# Patient Record
Sex: Female | Born: 1985
Health system: Southern US, Community
[De-identification: ages and names within clinical notes are randomized; demographics above are authoritative.]

## PROBLEM LIST (undated history)

## (undated) ENCOUNTER — Inpatient Hospital Stay (HOSPITAL_COMMUNITY): Payer: Self-pay

## (undated) ENCOUNTER — Emergency Department (HOSPITAL_BASED_OUTPATIENT_CLINIC_OR_DEPARTMENT_OTHER): Admission: EM | Payer: Self-pay | Source: Home / Self Care

## (undated) DIAGNOSIS — I209 Angina pectoris, unspecified: Secondary | ICD-10-CM

## (undated) HISTORY — PX: NO PAST SURGERIES: SHX2092

---

## 2004-08-14 ENCOUNTER — Encounter: Admission: RE | Admit: 2004-08-14 | Discharge: 2004-08-14 | Payer: Self-pay | Admitting: Emergency Medicine

## 2012-01-22 ENCOUNTER — Other Ambulatory Visit (HOSPITAL_BASED_OUTPATIENT_CLINIC_OR_DEPARTMENT_OTHER): Payer: Self-pay | Admitting: Internal Medicine

## 2012-01-22 ENCOUNTER — Ambulatory Visit (HOSPITAL_BASED_OUTPATIENT_CLINIC_OR_DEPARTMENT_OTHER)
Admission: RE | Admit: 2012-01-22 | Discharge: 2012-01-22 | Disposition: A | Discharge status: Home / Self Care | Payer: PRIVATE HEALTH INSURANCE | Source: Ambulatory Visit | Attending: Internal Medicine | Admitting: Internal Medicine

## 2012-01-22 DIAGNOSIS — IMO0002 Reserved for concepts with insufficient information to code with codable children: Secondary | ICD-10-CM

## 2012-01-22 DIAGNOSIS — R609 Edema, unspecified: Secondary | ICD-10-CM

## 2012-02-18 ENCOUNTER — Inpatient Hospital Stay (HOSPITAL_COMMUNITY)
Admission: AD | Admit: 2012-02-18 | Discharge: 2012-02-18 | Disposition: A | Discharge status: Home / Self Care | Payer: Medicaid Other | Source: Ambulatory Visit | Attending: Obstetrics & Gynecology | Admitting: Obstetrics & Gynecology

## 2012-02-18 ENCOUNTER — Encounter (HOSPITAL_COMMUNITY): Payer: Self-pay | Admitting: *Deleted

## 2012-02-18 ENCOUNTER — Inpatient Hospital Stay (HOSPITAL_COMMUNITY): Payer: Medicaid Other

## 2012-02-18 DIAGNOSIS — Z3490 Encounter for supervision of normal pregnancy, unspecified, unspecified trimester: Secondary | ICD-10-CM

## 2012-02-18 DIAGNOSIS — O2390 Unspecified genitourinary tract infection in pregnancy, unspecified trimester: Secondary | ICD-10-CM | POA: Insufficient documentation

## 2012-02-18 DIAGNOSIS — R109 Unspecified abdominal pain: Secondary | ICD-10-CM | POA: Insufficient documentation

## 2012-02-18 DIAGNOSIS — B373 Candidiasis of vulva and vagina: Secondary | ICD-10-CM | POA: Insufficient documentation

## 2012-02-18 DIAGNOSIS — B3731 Acute candidiasis of vulva and vagina: Secondary | ICD-10-CM | POA: Insufficient documentation

## 2012-02-18 DIAGNOSIS — O209 Hemorrhage in early pregnancy, unspecified: Secondary | ICD-10-CM | POA: Insufficient documentation

## 2012-02-18 HISTORY — DX: Angina pectoris, unspecified: I20.9

## 2012-02-18 LAB — POCT PREGNANCY, URINE: Preg Test, Ur: POSITIVE — AB

## 2012-02-18 LAB — URINALYSIS, ROUTINE W REFLEX MICROSCOPIC
Bilirubin Urine: NEGATIVE
Glucose, UA: NEGATIVE mg/dL
Hgb urine dipstick: NEGATIVE
Ketones, ur: 15 mg/dL — AB
Nitrite: NEGATIVE
Protein, ur: NEGATIVE mg/dL
Specific Gravity, Urine: >1.03 — ABNORMAL HIGH (ref 1.005–1.030)
pH: 6 (ref 5.0–8.0)

## 2012-02-18 LAB — CBC
HCT: 33 % — ABNORMAL LOW (ref 36.0–46.0)
Hemoglobin: 10.6 g/dL — ABNORMAL LOW (ref 12.0–15.0)
Platelets: 305 10*3/uL (ref 150–400)
RBC: 4.09 MIL/uL (ref 3.87–5.11)
WBC: 5.3 10*3/uL (ref 4.0–10.5)

## 2012-02-18 LAB — WET PREP, GENITAL

## 2012-02-18 MED ORDER — FLUCONAZOLE 150 MG PO TABS: 150.0000 mg | ORAL_TABLET | Freq: Once | ORAL | Status: DC

## 2012-02-18 NOTE — MAU Note (Signed)
Pt verbalizes understanding 

## 2012-02-18 NOTE — MAU Note (Signed)
Patient states she had a positive pregnancy test a a MD on [address] about 1 1/2 weeks ago.

## 2012-02-18 NOTE — MAU Provider Note (Signed)
History     CSN: [id number]  Arrival date and time: 02/18/12 0936   None     Chief Complaint  Patient presents with  . Abdominal Pain  . Possible Pregnancy   HPI Cramping x 3 days, no bleeding. Planning care at [facility].     Past Medical History  Diagnosis Date  . Anginal pain     intermittent- had cardiac workup- unknown cause    Past Surgical History  Procedure Date  . No past surgeries     Family History  Problem Relation Age of Onset  . Other Neg Hx     History  Substance Use Topics  . Smoking status: Former Games developer  . Smokeless tobacco: Never Used  . Alcohol Use: No    Allergies: No Known Allergies  No prescriptions prior to admission    Review of Systems  Constitutional: Negative.   Respiratory: Negative.   Cardiovascular: Negative.   Gastrointestinal: Positive for abdominal pain. Negative for nausea, vomiting, diarrhea and constipation.  Genitourinary: Negative for dysuria, urgency, frequency, hematuria and flank pain.       Negative for vaginal bleeding, vaginal discharge, dyspareunia  Musculoskeletal: Negative.   Neurological: Negative.   Psychiatric/Behavioral: Negative.    Physical Exam   Blood pressure 122/65, pulse 88, temperature 98.7 F (37.1 C), temperature source Oral, resp. rate 18, height 5' 8.5" (1.74 m), weight 273 lb 9.6 oz (124.104 kg), last menstrual period 01/02/2012, SpO2 100.00%.  Physical Exam  Constitutional: She is oriented to person, place, and time. She appears well-developed and well-nourished. No distress.  HENT:  Head: Normocephalic and atraumatic.  Cardiovascular: Normal rate.   Respiratory: Effort normal. No respiratory distress.  GI: Soft. She exhibits no distension and no mass. There is no tenderness. There is no rebound and no guarding.  Genitourinary: There is no rash or lesion on the right labia. There is no rash or lesion on the left labia. Uterus is not deviated, not enlarged, not fixed and not tender.  Cervix exhibits no motion tenderness, no discharge and no friability. Right adnexum displays no mass, no tenderness and no fullness. Left adnexum displays no mass, no tenderness and no fullness. No erythema, tenderness or bleeding around the vagina. No vaginal discharge found.  Neurological: She is alert and oriented to person, place, and time.  Skin: Skin is warm and dry.  Psychiatric: She has a normal mood and affect.    MAU Course  Procedures Results for orders placed during the hospital encounter of 02/18/12 (from the past 24 hour(s))  URINALYSIS, ROUTINE W REFLEX MICROSCOPIC     Status: Abnormal   Collection Time   02/18/12 10:28 AM      Component Value Range   Color, Urine YELLOW  YELLOW   APPearance CLEAR  CLEAR   Specific Gravity, Urine >1.030 (*) 1.005 - 1.030   pH 6.0  5.0 - 8.0   Glucose, UA NEGATIVE  NEGATIVE mg/dL   Hgb urine dipstick NEGATIVE  NEGATIVE   Bilirubin Urine NEGATIVE  NEGATIVE   Ketones, ur 15 (*) NEGATIVE mg/dL   Protein, ur NEGATIVE  NEGATIVE mg/dL   Urobilinogen, UA 1.0  0.0 - 1.0 mg/dL   Nitrite NEGATIVE  NEGATIVE   Leukocytes, UA NEGATIVE  NEGATIVE  POCT PREGNANCY, URINE     Status: Abnormal   Collection Time   02/18/12 10:32 AM      Component Value Range   Preg Test, Ur POSITIVE (*) NEGATIVE  CBC     Status: Abnormal  Collection Time   02/18/12 11:05 AM      Component Value Range   WBC 5.3  4.0 - 10.5 K/uL   RBC 4.09  3.87 - 5.11 MIL/uL   Hemoglobin 10.6 (*) 12.0 - 15.0 g/dL   HCT 86.5 (*) 78.4 - 69.6 %   MCV 80.7  78.0 - 100.0 fL   MCH 25.9 (*) 26.0 - 34.0 pg   MCHC 32.1  30.0 - 36.0 g/dL   RDW 29.5  28.4 - 13.2 %   Platelets 305  150 - 400 K/uL  ABO/RH     Status: Normal   Collection Time   02/18/12 11:05 AM      Component Value Range   ABO/RH(D) O POS    HCG, QUANTITATIVE, PREGNANCY     Status: Abnormal   Collection Time   02/18/12 11:05 AM      Component Value Range   hCG, Beta Chain, Quant, S 11735 (*) <5 mIU/mL  WET PREP, GENITAL      Status: Abnormal   Collection Time   02/18/12  1:26 PM      Component Value Range   Yeast Wet Prep HPF POC NONE SEEN  NONE SEEN   Trich, Wet Prep FEW (*) NONE SEEN   Clue Cells Wet Prep HPF POC RARE (*) NONE SEEN   WBC, Wet Prep HPF POC RARE (*) NONE SEEN   U/s: 6.1 week IUP, + FHR, c/w LMP, moderate subchorionic hemorrhage   Assessment and Plan   Cramping in early pregnancy Subchorionic hemorrhage Yeast vaginitis  Follow-up Information    Follow up with [facility]. (as soon as possible for prenatal care)    Contact information:   [address], [address] [place] [address] [phone]          Medication List     As of 02/18/2012  3:17 PM    START taking these medications         fluconazole 150 MG tablet   Commonly known as: DIFLUCAN   Take 1 tablet (150 mg total) by mouth once.      CONTINUE taking these medications         etodolac 600 MG 24 hr tablet   Commonly known as: LODINE XL      prenatal multivitamin Tabs      traMADol-acetaminophen 37.5-325 MG per tablet   Commonly known as: ULTRACET          Where to get your medications    These are the prescriptions that you need to pick up. We sent them to a specific pharmacy, so you will need to go there to get them.   [id number] - [place] ([place]), [place] - [address]    [address] [place] ([place]) [place] [address]    Phone: [phone]        fluconazole 150 MG tablet              Ariana Rice 02/18/2012, 3:16 PM

## 2012-02-18 NOTE — MAU Note (Signed)
Been having lower abd cramping.  Found out preg a wk ago. Plans to go to [facility]. Waiting on medicaid.

## 2012-02-18 NOTE — MAU Note (Signed)
Patient states she has had upper and lower abdominal pain off and on for about 3 days. Patient denies bleeding or vaginal discharge. Nausea, no vomiting for about 4 days.

## 2012-02-19 LAB — GC/CHLAMYDIA PROBE AMP, GENITAL: GC Probe Amp, Genital: NEGATIVE

## 2012-03-25 LAB — OB RESULTS CONSOLE HGB/HCT, BLOOD
HCT: 34 %
Hemoglobin: 11.3 g/dL

## 2012-03-25 LAB — OB RESULTS CONSOLE PLATELET COUNT: Platelets: 324 10*3/uL

## 2012-03-25 LAB — OB RESULTS CONSOLE RUBELLA ANTIBODY, IGM: Rubella: IMMUNE

## 2012-03-25 LAB — OB RESULTS CONSOLE HEPATITIS B SURFACE ANTIGEN: Hepatitis B Surface Ag: NEGATIVE

## 2012-03-25 LAB — OB RESULTS CONSOLE ABO/RH

## 2012-03-25 LAB — OB RESULTS CONSOLE RPR: RPR: NONREACTIVE

## 2012-03-25 LAB — SICKLE CELL SCREEN: Sickle Cell Screen: NEGATIVE

## 2012-05-18 NOTE — L&D Delivery Note (Signed)
Delivery Note At 3:49 PM a viable female was delivered via Vaginal, Spontaneous Delivery (Presentation: Right Occiput Anterior).  APGAR: 9, 9.   Placenta status: delivered w/cord traction; intact .  Cord: 3 vessels with the following complications: None.  Anesthesia: Epidural  Episiotomy: None Lacerations: 2nd degree Suture Repair: 2.0 3.0 vicryl rapide Est. Blood Loss (mL): 400 ml  Mom to postpartum.  Baby to nursery-stable.  JACKSON-MOORE,Haven Foss A 10/02/2012, 4:29 PM

## 2012-07-07 ENCOUNTER — Other Ambulatory Visit (HOSPITAL_COMMUNITY): Payer: Self-pay | Admitting: Obstetrics & Gynecology

## 2012-07-07 DIAGNOSIS — Z3689 Encounter for other specified antenatal screening: Secondary | ICD-10-CM

## 2012-07-07 DIAGNOSIS — IMO0002 Reserved for concepts with insufficient information to code with codable children: Secondary | ICD-10-CM

## 2012-07-12 ENCOUNTER — Ambulatory Visit (HOSPITAL_COMMUNITY): Payer: Medicaid Other

## 2012-07-18 ENCOUNTER — Ambulatory Visit (HOSPITAL_COMMUNITY)
Admission: RE | Admit: 2012-07-18 | Discharge: 2012-07-18 | Disposition: A | Discharge status: Home / Self Care | Payer: Medicaid Other | Source: Ambulatory Visit | Attending: Obstetrics & Gynecology | Admitting: Obstetrics & Gynecology

## 2012-07-18 DIAGNOSIS — Z3689 Encounter for other specified antenatal screening: Secondary | ICD-10-CM

## 2012-07-18 DIAGNOSIS — O358XX0 Maternal care for other (suspected) fetal abnormality and damage, not applicable or unspecified: Secondary | ICD-10-CM | POA: Insufficient documentation

## 2012-07-18 DIAGNOSIS — Z1389 Encounter for screening for other disorder: Secondary | ICD-10-CM | POA: Insufficient documentation

## 2012-07-18 DIAGNOSIS — IMO0002 Reserved for concepts with insufficient information to code with codable children: Secondary | ICD-10-CM

## 2012-07-18 DIAGNOSIS — Z363 Encounter for antenatal screening for malformations: Secondary | ICD-10-CM | POA: Insufficient documentation

## 2012-08-12 ENCOUNTER — Encounter: Payer: Self-pay | Admitting: *Deleted

## 2012-08-15 ENCOUNTER — Ambulatory Visit (INDEPENDENT_AMBULATORY_CARE_PROVIDER_SITE_OTHER): Payer: Medicaid Other | Admitting: Obstetrics & Gynecology

## 2012-08-15 VITALS — BP 119/81 | Temp 98.3°F | Wt 282.4 lb

## 2012-08-15 DIAGNOSIS — Z3403 Encounter for supervision of normal first pregnancy, third trimester: Secondary | ICD-10-CM

## 2012-08-15 DIAGNOSIS — O4700 False labor before 37 completed weeks of gestation, unspecified trimester: Secondary | ICD-10-CM

## 2012-08-15 DIAGNOSIS — L989 Disorder of the skin and subcutaneous tissue, unspecified: Secondary | ICD-10-CM

## 2012-08-15 DIAGNOSIS — Z3400 Encounter for supervision of normal first pregnancy, unspecified trimester: Secondary | ICD-10-CM

## 2012-08-15 LAB — POCT URINALYSIS DIPSTICK
Spec Grav, UA: 1.02
Urobilinogen, UA: NEGATIVE

## 2012-08-15 NOTE — Progress Notes (Signed)
Pulse- 106.  Patient states that she has an abnormal white discharge with burning/irritation.

## 2012-08-15 NOTE — Patient Instructions (Addendum)
Folliculitis   Folliculitis is redness, soreness, and swelling (inflammation) of the hair follicles. This condition can occur anywhere on the body. People with weakened immune systems, diabetes, or obesity have a greater risk of getting folliculitis.  CAUSES   Bacterial infection. This is the most common cause.   Fungal infection.   Viral infection.   Contact with certain chemicals, especially oils and tars.  Long-term folliculitis can result from bacteria that live in the nostrils. The bacteria may trigger multiple outbreaks of folliculitis over time.  SYMPTOMS  Folliculitis most commonly occurs on the scalp, thighs, legs, back, buttocks, and areas where hair is shaved frequently. An early sign of folliculitis is a small, white or yellow, pus-filled, itchy lesion (pustule). These lesions appear on a red, inflamed follicle. They are usually less than 0.2 inches (5 mm) wide. When there is an infection of the follicle that goes deeper, it becomes a boil or furuncle. A group of closely packed boils creates a larger lesion (carbuncle). Carbuncles tend to occur in hairy, sweaty areas of the body.  DIAGNOSIS   Your caregiver can usually tell what is wrong by doing a physical exam. A sample may be taken from one of the lesions and tested in a lab. This can help determine what is causing your folliculitis.  TREATMENT   Treatment may include:   Applying warm compresses to the affected areas.   Taking antibiotic medicines orally or applying them to the skin.   Draining the lesions if they contain a large amount of pus or fluid.   Laser hair removal for cases of long-lasting folliculitis. This helps to prevent regrowth of the hair.  HOME CARE INSTRUCTIONS   Apply warm compresses to the affected areas as directed by your caregiver.   If antibiotics are prescribed, take them as directed. Finish them even if you start to feel better.   You may take over-the-counter medicines to relieve itching.   Do not shave  irritated skin.   Follow up with your caregiver as directed.  SEEK IMMEDIATE MEDICAL CARE IF:    You have increasing redness, swelling, or pain in the affected area.   You have a fever.  MAKE SURE YOU:   Understand these instructions.   Will watch your condition.   Will get help right away if you are not doing well or get worse.  Document Released: 07/13/2001 Document Revised: 11/03/2011 Document Reviewed: 08/04/2011  ExitCare Patient Information 2013 [employer].

## 2012-08-16 ENCOUNTER — Encounter: Payer: Self-pay | Admitting: Obstetrics & Gynecology

## 2012-08-16 DIAGNOSIS — Z3400 Encounter for supervision of normal first pregnancy, unspecified trimester: Secondary | ICD-10-CM | POA: Insufficient documentation

## 2012-08-16 NOTE — Progress Notes (Signed)
Doing well 

## 2012-08-22 ENCOUNTER — Ambulatory Visit (INDEPENDENT_AMBULATORY_CARE_PROVIDER_SITE_OTHER): Payer: Medicaid Other | Admitting: Obstetrics & Gynecology

## 2012-08-22 ENCOUNTER — Encounter: Payer: Self-pay | Admitting: Obstetrics & Gynecology

## 2012-08-22 VITALS — BP 119/76 | Temp 98.0°F | Wt 285.0 lb

## 2012-08-22 DIAGNOSIS — Z3400 Encounter for supervision of normal first pregnancy, unspecified trimester: Secondary | ICD-10-CM

## 2012-08-22 DIAGNOSIS — E669 Obesity, unspecified: Secondary | ICD-10-CM

## 2012-08-22 LAB — POCT URINALYSIS DIPSTICK
Bilirubin, UA: NEGATIVE
Spec Grav, UA: 1.02
pH, UA: 7

## 2012-08-22 NOTE — Progress Notes (Signed)
SG 1020,pH 5, WBC +2, KET +1

## 2012-08-22 NOTE — Progress Notes (Signed)
Pulse-97 No complaints

## 2012-08-22 NOTE — Patient Instructions (Signed)
Fetal Monitoring, Nonstress Test The nonstress test (NST) is a procedure that monitors the increase of the fetal heart rate in response to the fetal movement. An increase in the fetal heart rate during fetal movement shows that the pregnancy is normal. The increase also shows the well-being of the baby's central nervous system. Fetal activity may be spontaneous, induced a by uterine contraction, or induced by external stimulation with an artificial voice box. Oxytocin stimulation is not used in this procedure. This test is also done to see if there are problems with the pregnancy and the baby. Identifying and correcting problems may prevent serious problems from developing with the fetus, including fetal loss.  OTHER TECHNIQUES OF MONITORING YOUR BABY PRIOR TO BIRTH:  Fetal movement assessment. This is done by the pregnant woman herself by counting and recording the baby's movements over a certain period of theme.  Contraction stress test (CST). This test monitors the baby's heart rate during a contraction of the uterus.  Fetal biophysical profile (BPP). This measures and evaluates 5 observations of the baby:  The nonstress test.  The baby's breathing.  The baby's movements.  The baby's muscle tone.  The amount of fluid in the amniotic sac.  Modified biophysical profile. This measures the volume of fluid in different parts of the amniotic sac (amniotic fluid index) and the results of the nonstress test.  Umbilical artery doppler velocimetry. This evaluates the blood flow through the umbilical cord. There are several very serious problems that cannot be predicted or detected with any of the fetal monitoring procedures. These problems include separation (abruption) of the placenta and choking of the baby with the umbilical cord (umbilical cord accident). Your caregiver will help you understand the tests and what they mean for you and your baby. It is your responsibility to obtain the results of  your test. LET YOUR CAREGIVER KNOW ABOUT:  Any medications you are taking including prescription and over-the-counter drugs, herbs, eye drops and creams.  If you have a fever.  If you have an infection.  If you are sick. RISKS AND COMPLICATIONS  There are no risks or complications to the mother or fetus with this test. BEFORE THE PROCEDURE  Do not take medications that may increase or decrease the baby's heart rate and/or movements.  Have a full meal at least 2 hours before the test.  Do not smoke if you are pregnant. If you smoke, stop at least 2 days before the test. It is a good idea not to smoke at all when you are pregnant. PROCEDURE The NST is based on the idea that the heart rate of a normal baby will speed up while the baby is moving around. This is an indicator of a normal working pregnancy. Loss of movement is seen commonly while the baby is sleeping or if there are problems in the pregnancy. Smoking may hurt test results.  With the patient lying on her side, the fetal heart rate is checked with an electrode on the belly.  The line drawn from a recording instrument (tracing) is observed for fetal heart rate accelerations that speed up at least 15 beats per minute above resting, and last 15 seconds. Tracings of 40 minutes or longer may be necessary.  Sound stimulation of the healthy fetus may speed up a baby's heart. This also means the baby is healthy. Stimulation helps to reduce testing time and helps find problems in the pregnancy if there is any. To do this, an artificial voice box is   put on the mother's belly for about 12 seconds. This may be repeated up to 3 times for progressively longer durations.  Results are categorized as normal (reactive) or abnormal (nonreactive). Commonly, the NST is considered normal (reactive) if there are 2 or more fetal heart rate accelerations as described inside a 20-minute period. This is with or without fetal movement the mother feels. A  nonreactive NST is one that does not have enough fetal heart rate accelerations over a 40 minute period.  Abnormal testing may need further testing.  Your caregiver will help you understand your tests and what they mean for you and your baby. It should be noted also that:  The NST can be nonreactive 50% of the time in weeks 24 to 28 of a normal pregnancy.  The NST can be nonreactive 15% of the time in weeks 28 to 32 of a normal pregnancy.  Lower fetal heart rate (decelerations) may be seen in 50% of NST's, but if they are consistent (3 in 20 minutes), it increases the risk for Cesarean section.  Decelerations of the fetal heart rate that last for one minute or longer indicates a very serious problem with the baby and a Cesarean section may be needed right away. AFTER THE PROCEDURE  You may go home and resume your usual activities, or as directed by your caregiver. HOME CARE INSTRUCTIONS   Follow your caregiver's advice and recommendations.  Beware of your baby's movements. Are they normal, less than usual or more than usual?  Make and keep the rest of your prenatal appointments. SEEK MEDICAL CARE IF:   You develop a temperature of 100 F (37.9 C) or higher.  You have a bloody mucus discharge from the vagina (a bloody show). SEEK IMMEDIATE MEDICAL CARE IF:   You do not feel the baby move.  You think the baby's movements are less than usual or more than usual.  You develop contractions.  You develop vaginal bleeding.  You develop belly (abdominal) pain.  You have leaking or a gush of fluid from the vagina. Document Released: 04/24/2002 Document Revised: 07/27/2011 Document Reviewed: 08/28/2008 Exit[employer] Patient Information 2013 [employer].  

## 2012-08-23 ENCOUNTER — Encounter: Payer: Self-pay | Admitting: Obstetrics & Gynecology

## 2012-08-23 NOTE — Progress Notes (Signed)
NST OK.

## 2012-08-29 ENCOUNTER — Encounter: Payer: Self-pay | Admitting: Obstetrics & Gynecology

## 2012-08-29 ENCOUNTER — Ambulatory Visit (INDEPENDENT_AMBULATORY_CARE_PROVIDER_SITE_OTHER): Payer: Medicaid Other | Admitting: Obstetrics & Gynecology

## 2012-08-29 ENCOUNTER — Encounter: Payer: Self-pay | Admitting: *Deleted

## 2012-08-29 VITALS — BP 113/76 | Temp 98.1°F | Wt 286.2 lb

## 2012-08-29 DIAGNOSIS — Z3483 Encounter for supervision of other normal pregnancy, third trimester: Secondary | ICD-10-CM

## 2012-08-29 DIAGNOSIS — E669 Obesity, unspecified: Secondary | ICD-10-CM

## 2012-08-29 DIAGNOSIS — Z3480 Encounter for supervision of other normal pregnancy, unspecified trimester: Secondary | ICD-10-CM

## 2012-08-29 LAB — POCT URINALYSIS DIPSTICK
Bilirubin, UA: NEGATIVE
Blood, UA: NEGATIVE
Glucose, UA: NEGATIVE
Nitrite, UA: NEGATIVE
Spec Grav, UA: 1.025
Urobilinogen, UA: NEGATIVE

## 2012-08-29 NOTE — Progress Notes (Signed)
Pulse- 96 

## 2012-08-29 NOTE — Patient Instructions (Addendum)
Pregnancy - Third Trimester  The third trimester of pregnancy (the last 3 months) is a period of the most rapid growth for you and your baby. The baby approaches a length of 20 inches and a weight of 6 to 10 pounds. The baby is adding on fat and getting ready for life outside your body. While inside, babies have periods of sleeping and waking, suck their thumbs, and hiccups. You can often feel small contractions of the uterus. This is false labor. It is also called Braxton-Hicks contractions. This is like a practice for labor. The usual problems in this stage of pregnancy include more difficulty breathing, swelling of the hands and feet from water retention, and having to urinate more often because of the uterus and baby pressing on your bladder.   PRENATAL EXAMS  · Blood work may continue to be done during prenatal exams. These tests are done to check on your health and the probable health of your baby. Blood work is used to follow your blood levels (hemoglobin). Anemia (low hemoglobin) is common during pregnancy. Iron and vitamins are given to help prevent this. You may also continue to be checked for diabetes. Some of the past blood tests may be done again.  · The size of the uterus is measured during each visit. This makes sure your baby is growing properly according to your pregnancy dates.  · Your blood pressure is checked every prenatal visit. This is to make sure you are not getting toxemia.  · Your urine is checked every prenatal visit for infection, diabetes and protein.  · Your weight is checked at each visit. This is done to make sure gains are happening at the suggested rate and that you and your baby are growing normally.  · Sometimes, an ultrasound is performed to confirm the position and the proper growth and development of the baby. This is a test done that bounces harmless sound waves off the baby so your caregiver can more accurately determine due dates.  · Discuss the type of pain medication and  anesthesia you will have during your labor and delivery.  · Discuss the possibility and anesthesia if a Cesarean Section might be necessary.  · Inform your caregiver if there is any mental or physical violence at home.  Sometimes, a specialized non-stress test, contraction stress test and biophysical profile are done to make sure the baby is not having a problem. Checking the amniotic fluid surrounding the baby is called an amniocentesis. The amniotic fluid is removed by sticking a needle into the belly (abdomen). This is sometimes done near the end of pregnancy if an early delivery is required. In this case, it is done to help make sure the baby's lungs are mature enough for the baby to live outside of the womb. If the lungs are not mature and it is unsafe to deliver the baby, an injection of cortisone medication is given to the mother 1 to 2 days before the delivery. This helps the baby's lungs mature and makes it safer to deliver the baby.  CHANGES OCCURING IN THE THIRD TRIMESTER OF PREGNANCY  Your body goes through many changes during pregnancy. They vary from person to person. Talk to your caregiver about changes you notice and are concerned about.  · During the last trimester, you have probably had an increase in your appetite. It is normal to have cravings for certain foods. This varies from person to person and pregnancy to pregnancy.  · You may begin to   get stretch marks on your hips, abdomen, and breasts. These are normal changes in the body during pregnancy. There are no exercises or medications to take which prevent this change.  · Constipation may be treated with a stool softener or adding bulk to your diet. Drinking lots of fluids, fiber in vegetables, fruits, and whole grains are helpful.  · Exercising is also helpful. If you have been very active up until your pregnancy, most of these activities can be continued during your pregnancy. If you have been less active, it is helpful to start an exercise  program such as walking. Consult your caregiver before starting exercise programs.  · Avoid all smoking, alcohol, un-prescribed drugs, herbs and "street drugs" during your pregnancy. These chemicals affect the formation and growth of the baby. Avoid chemicals throughout the pregnancy to ensure the delivery of a healthy infant.  · Backache, varicose veins and hemorrhoids may develop or get worse.  · You will tire more easily in the third trimester, which is normal.  · The baby's movements may be stronger and more often.  · You may become short of breath easily.  · Your belly button may stick out.  · A yellow discharge may leak from your breasts called colostrum.  · You may have a bloody mucus discharge. This usually occurs a few days to a week before labor begins.  HOME CARE INSTRUCTIONS   · Keep your caregiver's appointments. Follow your caregiver's instructions regarding medication use, exercise, and diet.  · During pregnancy, you are providing food for you and your baby. Continue to eat regular, well-balanced meals. Choose foods such as meat, fish, milk and other low fat dairy products, vegetables, fruits, and whole-grain breads and cereals. Your caregiver will tell you of the ideal weight gain.  · A physical sexual relationship may be continued throughout pregnancy if there are no other problems such as early (premature) leaking of amniotic fluid from the membranes, vaginal bleeding, or belly (abdominal) pain.  · Exercise regularly if there are no restrictions. Check with your caregiver if you are unsure of the safety of your exercises. Greater weight gain will occur in the last 2 trimesters of pregnancy. Exercising helps:  · Control your weight.  · Get you in shape for labor and delivery.  · You lose weight after you deliver.  · Rest a lot with legs elevated, or as needed for leg cramps or low back pain.  · Wear a good support or jogging bra for breast tenderness during pregnancy. This may help if worn during  sleep. Pads or tissues may be used in the bra if you are leaking colostrum.  · Do not use hot tubs, steam rooms, or saunas.  · Wear your seat belt when driving. This protects you and your baby if you are in an accident.  · Avoid raw meat, cat litter boxes and soil used by cats. These carry germs that can cause birth defects in the baby.  · It is easier to loose urine during pregnancy. Tightening up and strengthening the pelvic muscles will help with this problem. You can practice stopping your urination while you are going to the bathroom. These are the same muscles you need to strengthen. It is also the muscles you would use if you were trying to stop from passing gas. You can practice tightening these muscles up 10 times a set and repeating this about 3 times per day. Once you know what muscles to tighten up, do not perform these   exercises during urination. It is more likely to cause an infection by backing up the urine.  · Ask for help if you have financial, counseling or nutritional needs during pregnancy. Your caregiver will be able to offer counseling for these needs as well as refer you for other special needs.  · Make a list of emergency phone numbers and have them available.  · Plan on getting help from family or friends when you go home from the hospital.  · Make a trial run to the hospital.  · Take prenatal classes with the father to understand, practice and ask questions about the labor and delivery.  · Prepare the baby's room/nursery.  · Do not travel out of the city unless it is absolutely necessary and with the advice of your caregiver.  · Wear only low or no heal shoes to have better balance and prevent falling.  MEDICATIONS AND DRUG USE IN PREGNANCY  · Take prenatal vitamins as directed. The vitamin should contain 1 milligram of folic acid. Keep all vitamins out of reach of children. Only a couple vitamins or tablets containing iron may be fatal to a baby or young child when ingested.  · Avoid use  of all medications, including herbs, over-the-counter medications, not prescribed or suggested by your caregiver. Only take over-the-counter or prescription medicines for pain, discomfort, or fever as directed by your caregiver. Do not use aspirin, ibuprofen (Motrin®, Advil®, Nuprin®) or naproxen (Aleve®) unless OK'd by your caregiver.  · Let your caregiver also know about herbs you may be using.  · Alcohol is related to a number of birth defects. This includes fetal alcohol syndrome. All alcohol, in any form, should be avoided completely. Smoking will cause low birth rate and premature babies.  · Street/illegal drugs are very harmful to the baby. They are absolutely forbidden. A baby born to an addicted mother will be addicted at birth. The baby will go through the same withdrawal an adult does.  SEEK MEDICAL CARE IF:  You have any concerns or worries during your pregnancy. It is better to call with your questions if you feel they cannot wait, rather than worry about them.  DECISIONS ABOUT CIRCUMCISION  You may or may not know the sex of your baby. If you know your baby is a boy, it may be time to think about circumcision. Circumcision is the removal of the foreskin of the penis. This is the skin that covers the sensitive end of the penis. There is no proven medical need for this. Often this decision is made on what is popular at the time or based upon religious beliefs and social issues. You can discuss these issues with your caregiver or pediatrician.  SEEK IMMEDIATE MEDICAL CARE IF:   · An unexplained oral temperature above 102° F (38.9° C) develops, or as your caregiver suggests.  · You have leaking of fluid from the vagina (birth canal). If leaking membranes are suspected, take your temperature and tell your caregiver of this when you call.  · There is vaginal spotting, bleeding or passing clots. Tell your caregiver of the amount and how many pads are used.  · You develop a bad smelling vaginal discharge with  a change in the color from clear to white.  · You develop vomiting that lasts more than 24 hours.  · You develop chills or fever.  · You develop shortness of breath.  · You develop burning on urination.  · You loose more than 2 pounds of weight   or gain more than 2 pounds of weight or as suggested by your caregiver.  · You notice sudden swelling of your face, hands, and feet or legs.  · You develop belly (abdominal) pain. Round ligament discomfort is a common non-cancerous (benign) cause of abdominal pain in pregnancy. Your caregiver still must evaluate you.  · You develop a severe headache that does not go away.  · You develop visual problems, blurred or double vision.  · If you have not felt your baby move for more than 1 hour. If you think the baby is not moving as much as usual, eat something with sugar in it and lie down on your left side for an hour. The baby should move at least 4 to 5 times per hour. Call right away if your baby moves less than that.  · You fall, are in a car accident or any kind of trauma.  · There is mental or physical violence at home.  Document Released: 04/28/2001 Document Revised: 07/27/2011 Document Reviewed: 10/31/2008  [employer]® Patient Information ©2013 [employer].

## 2012-08-29 NOTE — Progress Notes (Signed)
NST minimally reactive

## 2012-08-30 ENCOUNTER — Ambulatory Visit (INDEPENDENT_AMBULATORY_CARE_PROVIDER_SITE_OTHER): Payer: Medicaid Other

## 2012-08-30 ENCOUNTER — Encounter: Payer: Self-pay | Admitting: Obstetrics & Gynecology

## 2012-08-30 DIAGNOSIS — Z3400 Encounter for supervision of normal first pregnancy, unspecified trimester: Secondary | ICD-10-CM

## 2012-08-30 DIAGNOSIS — Z3403 Encounter for supervision of normal first pregnancy, third trimester: Secondary | ICD-10-CM

## 2012-08-31 LAB — US OB DETAIL + 14 WK

## 2012-09-01 ENCOUNTER — Encounter: Payer: Self-pay | Admitting: Obstetrics

## 2012-09-05 ENCOUNTER — Encounter: Payer: Medicaid Other | Admitting: Obstetrics & Gynecology

## 2012-09-05 ENCOUNTER — Encounter: Payer: Self-pay | Admitting: *Deleted

## 2012-09-05 ENCOUNTER — Other Ambulatory Visit: Payer: Self-pay | Admitting: *Deleted

## 2012-09-05 ENCOUNTER — Encounter: Payer: Self-pay | Admitting: Obstetrics & Gynecology

## 2012-09-05 DIAGNOSIS — Z3403 Encounter for supervision of normal first pregnancy, third trimester: Secondary | ICD-10-CM

## 2012-09-07 ENCOUNTER — Ambulatory Visit (INDEPENDENT_AMBULATORY_CARE_PROVIDER_SITE_OTHER): Payer: Medicaid Other | Admitting: Obstetrics & Gynecology

## 2012-09-07 ENCOUNTER — Encounter: Payer: Self-pay | Admitting: Obstetrics & Gynecology

## 2012-09-07 VITALS — BP 92/70 | Temp 99.1°F | Wt 285.0 lb

## 2012-09-07 DIAGNOSIS — Z3480 Encounter for supervision of other normal pregnancy, unspecified trimester: Secondary | ICD-10-CM

## 2012-09-07 DIAGNOSIS — Z3483 Encounter for supervision of other normal pregnancy, third trimester: Secondary | ICD-10-CM

## 2012-09-07 LAB — POCT URINALYSIS DIPSTICK
Spec Grav, UA: 1.02
Urobilinogen, UA: NEGATIVE
pH, UA: 6

## 2012-09-07 LAB — OB RESULTS CONSOLE GBS: GBS: NEGATIVE

## 2012-09-07 NOTE — Progress Notes (Signed)
P 101 Pt reports she is doing well 

## 2012-09-07 NOTE — Progress Notes (Signed)
NST reactive.

## 2012-09-07 NOTE — Patient Instructions (Signed)
Patient information: How to tell when labor starts (The Basics)View in SpanishWritten by the doctors and editors at [employer]  What is labor? - Labor is the way a woman's body prepares to give birth. Labor usually starts on its own between 37 and 42 weeks of pregnancy. A woman's "due date" is at 40 weeks.  A pregnancy that lasts 37 to 42 weeks is called a "term" pregnancy. When labor starts before 37 weeks, doctors call it "preterm" labor. What are the signs that labor is starting? - The different signs that labor is starting can include the following: ?The baby moves lower (or "drops") in your belly.  ?You have increased vaginal discharge that is thick, mucus-like, or slightly bloody. (Vaginal discharge is the term doctors use to describe the fluid that comes out of the vagina.) The increased vaginal discharge is sometimes called a "mucus plug" or a "bloody show."  ?Your water breaks. During pregnancy, your baby is in a sac in your uterus and surrounded by a fluid called "amniotic fluid." This sac will break open sometime before your baby is born. When it breaks open, the fluid inside comes out of your vagina. This can feel like a gush or trickle of fluid. ?You have low back pain or belly cramps. ?You start having contractions. During a contraction, the uterus tightens. This can be painful and make your belly feel hard. After a contraction, the uterus relaxes and the pain goes away. Some women have "Braxton Hicks contractions" or "false labor contractions." These feel like contractions, but they are not true contractions. They do not mean that you are in labor.  How can I tell if I'm having true contractions? - It can be hard to tell if you are having true contractions or Braxton Hicks contractions. But here are some ways to help tell the difference.  ?True contractions come every few minutes and get more frequent over time. Braxton Hicks contractions can come every few minutes, but they don't get more  frequent over time. ?True contractions don't go away, even when you rest. Braxton Hicks contractions usually go away when you rest. ?True contractions will get stronger and more painful over time. Braxton Hicks contractions usually don't get stronger or more painful over time. If you are still not sure whether you are having true contractions, call your doctor or midwife. What should I do if I start having contractions? - If you start having contractions, you should time them to see how far apart they are. That way, you can tell if they get more frequent.  You can time your contractions by writing down the time when each contraction starts. If you have a clock with a second hand, you can also time how long each contraction lasts. Your doctor or midwife will want to know how far apart your contractions are and how long they last. When should I call my doctor or midwife? - Call your doctor or midwife if you think you are in labor. You should also call if ANY of the following things happen: ?You have blood, mucus, or fluid leaking from your vagina. ?You have 6 or more contractions in 1 hour. (That means your contractions are 10 minutes apart or less.) ?Your contractions are getting stronger and are painful. Your doctor or midwife will probably want to see you to do an exam. To tell if you are in labor, he or she will check your cervix to see if it is opening ("dilated") and thinning out. He or she   will see how frequent your contractions are. He or she might also do other tests. What if my labor starts too soon? - If you start having any symptoms of labor before 37 weeks, call your doctor right away. He or she might want to give you medicine to try to stop your labor.  What if my labor doesn't start on its own? - If your labor doesn't start on its own, your doctor will talk to you about your options. He or she might try to start your labor with medicines. This is called "inducing labor." How long will my  labor last? - If it's your first baby, your labor will probably last for many hours. If it's not your first baby, your labor will probably be shorter.  

## 2012-09-13 ENCOUNTER — Encounter: Payer: Self-pay | Admitting: Obstetrics & Gynecology

## 2012-09-13 ENCOUNTER — Ambulatory Visit (INDEPENDENT_AMBULATORY_CARE_PROVIDER_SITE_OTHER): Payer: Medicaid Other

## 2012-09-13 DIAGNOSIS — Z3403 Encounter for supervision of normal first pregnancy, third trimester: Secondary | ICD-10-CM

## 2012-09-13 DIAGNOSIS — O365990 Maternal care for other known or suspected poor fetal growth, unspecified trimester, not applicable or unspecified: Secondary | ICD-10-CM

## 2012-09-13 DIAGNOSIS — IMO0002 Reserved for concepts with insufficient information to code with codable children: Secondary | ICD-10-CM | POA: Insufficient documentation

## 2012-09-13 LAB — US OB DETAIL + 14 WK

## 2012-09-15 ENCOUNTER — Ambulatory Visit (INDEPENDENT_AMBULATORY_CARE_PROVIDER_SITE_OTHER): Payer: Medicaid Other | Admitting: Obstetrics & Gynecology

## 2012-09-15 VITALS — BP 115/75 | Temp 98.0°F | Wt 289.0 lb

## 2012-09-15 DIAGNOSIS — Z3400 Encounter for supervision of normal first pregnancy, unspecified trimester: Secondary | ICD-10-CM

## 2012-09-15 DIAGNOSIS — Z3403 Encounter for supervision of normal first pregnancy, third trimester: Secondary | ICD-10-CM

## 2012-09-15 DIAGNOSIS — R81 Glycosuria: Secondary | ICD-10-CM

## 2012-09-15 DIAGNOSIS — E669 Obesity, unspecified: Secondary | ICD-10-CM

## 2012-09-15 LAB — POCT URINALYSIS DIPSTICK
Bilirubin, UA: NEGATIVE
Glucose, UA: 250
Nitrite, UA: NEGATIVE
Spec Grav, UA: 1.015

## 2012-09-15 NOTE — Progress Notes (Signed)
Pulse: 92

## 2012-09-15 NOTE — Patient Instructions (Addendum)
Fetal Monitoring, Nonstress Test The nonstress test (NST) is a procedure that monitors the increase of the fetal heart rate in response to the fetal movement. An increase in the fetal heart rate during fetal movement shows that the pregnancy is normal. The increase also shows the well-being of the baby's central nervous system. Fetal activity may be spontaneous, induced a by uterine contraction, or induced by external stimulation with an artificial voice box. Oxytocin stimulation is not used in this procedure. This test is also done to see if there are problems with the pregnancy and the baby. Identifying and correcting problems may prevent serious problems from developing with the fetus, including fetal loss.  OTHER TECHNIQUES OF MONITORING YOUR BABY PRIOR TO BIRTH:  Fetal movement assessment. This is done by the pregnant woman herself by counting and recording the baby's movements over a certain period of theme.  Contraction stress test (CST). This test monitors the baby's heart rate during a contraction of the uterus.  Fetal biophysical profile (BPP). This measures and evaluates 5 observations of the baby:  The nonstress test.  The baby's breathing.  The baby's movements.  The baby's muscle tone.  The amount of fluid in the amniotic sac.  Modified biophysical profile. This measures the volume of fluid in different parts of the amniotic sac (amniotic fluid index) and the results of the nonstress test.  Umbilical artery doppler velocimetry. This evaluates the blood flow through the umbilical cord. There are several very serious problems that cannot be predicted or detected with any of the fetal monitoring procedures. These problems include separation (abruption) of the placenta and choking of the baby with the umbilical cord (umbilical cord accident). Your caregiver will help you understand the tests and what they mean for you and your baby. It is your responsibility to obtain the results of  your test. LET YOUR CAREGIVER KNOW ABOUT:  Any medications you are taking including prescription and over-the-counter drugs, herbs, eye drops and creams.  If you have a fever.  If you have an infection.  If you are sick. RISKS AND COMPLICATIONS  There are no risks or complications to the mother or fetus with this test. BEFORE THE PROCEDURE  Do not take medications that may increase or decrease the baby's heart rate and/or movements.  Have a full meal at least 2 hours before the test.  Do not smoke if you are pregnant. If you smoke, stop at least 2 days before the test. It is a good idea not to smoke at all when you are pregnant. PROCEDURE The NST is based on the idea that the heart rate of a normal baby will speed up while the baby is moving around. This is an indicator of a normal working pregnancy. Loss of movement is seen commonly while the baby is sleeping or if there are problems in the pregnancy. Smoking may hurt test results.  With the patient lying on her side, the fetal heart rate is checked with an electrode on the belly.  The line drawn from a recording instrument (tracing) is observed for fetal heart rate accelerations that speed up at least 15 beats per minute above resting, and last 15 seconds. Tracings of 40 minutes or longer may be necessary.  Sound stimulation of the healthy fetus may speed up a baby's heart. This also means the baby is healthy. Stimulation helps to reduce testing time and helps find problems in the pregnancy if there is any. To do this, an artificial voice box is   put on the mother's belly for about 12 seconds. This may be repeated up to 3 times for progressively longer durations.  Results are categorized as normal (reactive) or abnormal (nonreactive). Commonly, the NST is considered normal (reactive) if there are 2 or more fetal heart rate accelerations as described inside a 20-minute period. This is with or without fetal movement the mother feels. A  nonreactive NST is one that does not have enough fetal heart rate accelerations over a 40 minute period.  Abnormal testing may need further testing.  Your caregiver will help you understand your tests and what they mean for you and your baby. It should be noted also that:  The NST can be nonreactive 50% of the time in weeks 24 to 28 of a normal pregnancy.  The NST can be nonreactive 15% of the time in weeks 28 to 32 of a normal pregnancy.  Lower fetal heart rate (decelerations) may be seen in 50% of NST's, but if they are consistent (3 in 20 minutes), it increases the risk for Cesarean section.  Decelerations of the fetal heart rate that last for one minute or longer indicates a very serious problem with the baby and a Cesarean section may be needed right away. AFTER THE PROCEDURE  You may go home and resume your usual activities, or as directed by your caregiver. HOME CARE INSTRUCTIONS   Follow your caregiver's advice and recommendations.  Beware of your baby's movements. Are they normal, less than usual or more than usual?  Make and keep the rest of your prenatal appointments. SEEK MEDICAL CARE IF:   You develop a temperature of 100 F (37.9 C) or higher.  You have a bloody mucus discharge from the vagina (a bloody show). SEEK IMMEDIATE MEDICAL CARE IF:   You do not feel the baby move.  You think the baby's movements are less than usual or more than usual.  You develop contractions.  You develop vaginal bleeding.  You develop belly (abdominal) pain.  You have leaking or a gush of fluid from the vagina. Document Released: 04/24/2002 Document Revised: 07/27/2011 Document Reviewed: 08/28/2008 [employer] Patient Information 2013 [employer].  

## 2012-09-19 ENCOUNTER — Other Ambulatory Visit: Payer: Medicaid Other

## 2012-09-19 ENCOUNTER — Ambulatory Visit (INDEPENDENT_AMBULATORY_CARE_PROVIDER_SITE_OTHER): Payer: Medicaid Other | Admitting: Obstetrics & Gynecology

## 2012-09-19 ENCOUNTER — Encounter: Payer: Medicaid Other | Admitting: Obstetrics & Gynecology

## 2012-09-19 VITALS — BP 107/72 | Temp 98.4°F | Wt 290.0 lb

## 2012-09-19 DIAGNOSIS — Z3400 Encounter for supervision of normal first pregnancy, unspecified trimester: Secondary | ICD-10-CM

## 2012-09-19 DIAGNOSIS — IMO0002 Reserved for concepts with insufficient information to code with codable children: Secondary | ICD-10-CM

## 2012-09-19 DIAGNOSIS — O365990 Maternal care for other known or suspected poor fetal growth, unspecified trimester, not applicable or unspecified: Secondary | ICD-10-CM

## 2012-09-19 DIAGNOSIS — E669 Obesity, unspecified: Secondary | ICD-10-CM

## 2012-09-19 DIAGNOSIS — Z3403 Encounter for supervision of normal first pregnancy, third trimester: Secondary | ICD-10-CM

## 2012-09-19 LAB — POCT URINALYSIS DIPSTICK
Bilirubin, UA: NEGATIVE
Blood, UA: NEGATIVE
Nitrite, UA: NEGATIVE
Spec Grav, UA: 1.02
Urobilinogen, UA: 2
pH, UA: 5

## 2012-09-19 NOTE — Progress Notes (Signed)
Pulse- 97.  CBG - 130.

## 2012-09-19 NOTE — Patient Instructions (Addendum)
Intrauterine Growth Restriction Intrauterine growth restriction (IUGR) means that the baby is smaller than normal at the time of the pregnancy or at birth. This should not be confused with Small for Gestational Age (SGA), which means the baby's weight at birth is at the lower end (less than 10%) of normal birth weights.  CAUSES  Medical problems with the mother:  High blood pressure.  Kidney, lung or heart disease.  Diabetes with arteriosclerosis.  Hemoglobinoathies- blood diseases.  Antiphospholipid antibody syndrome - a disorder of the immune system. Other causes:  Smoking, drug abuse and excessive alcohol drinking.  Diseases of the placenta.  Having twins or more.  Malnutrition.  Infections.  Genetic problems.  Pregnant women 69 years old or younger and pregnant women 46 years old or older.  Exposure to toxic chemicals. SYMPTOMS  Smaller than normal uterus when measuring the uterine size on the abdomen.  Ultrasound measurements of the fetuses head circumference, the abdominal circumference, the diameter of the biparietal area (sides) of the head and the length of the femur less than normal indicating IUGR. RISKS AND COMPLICATIONS  Fetal death in the uterus or a stillborn baby.  Not having enough fluid in the baby's sac (oligohydramnios).  Fetal heart rate problems. This leads to more Cesarean Section deliveries.  Low Apgar scores (evaluates the baby's condition at birth).  Increase in the acidity of the baby's blood (acidosis). SGA babies can also have complications such as:  Low blood sugar.  Increase of bilirubin in the blood.  Low body temperature (hypothermia)  Low Apgar scores, convulsions, fetal death and stillborn. TREATMENT   Close following and monitoring of the fetus during the pregnancy.  Treat infections that may be present.  Treat and control the medical disease present.  Look at the condition of the fetus with non-tress tests,  contraction stress tests and biophysical profile of the fetus.  Doppler ultrasound (measure the umbilical artery blood flow) lowers the risk of fetal death and stillborn by delivering the baby early if it is abnormal. HOME CARE INSTRUCTIONS   Follow your caregiver's advice, instructions and keep all of your prenatal appointments.  Get plenty of rest and sleep.  Eat a balanced diet and take all your vitamin and mineral supplements.  Do not over use your energy with hard exercise, work and household activities.  Do not exercise unless your caregiver says it is OK to do so.  Do not smoke, drink alcohol or take illegal drugs.  Avoid chemicals like pesticides. SEEK MEDICAL CARE IF:   You develop a temperature of 100 F (37.8 C) or higher.  You do not feel the baby moving as much or not at all.  You develop leaking of fluid from the vagina.  You develop vaginal bleeding.  You develop abdominal pain.  You develop uterine contractions. Document Released: 02/11/2008 Document Revised: 07/27/2011 Document Reviewed: 02/11/2008 [facility] Patient Information 2013 [place], [place].

## 2012-09-20 ENCOUNTER — Other Ambulatory Visit: Payer: Medicaid Other

## 2012-09-20 ENCOUNTER — Encounter: Payer: Self-pay | Admitting: Obstetrics & Gynecology

## 2012-09-20 ENCOUNTER — Other Ambulatory Visit: Payer: Self-pay | Admitting: Obstetrics & Gynecology

## 2012-09-20 ENCOUNTER — Ambulatory Visit (INDEPENDENT_AMBULATORY_CARE_PROVIDER_SITE_OTHER): Payer: Medicaid Other

## 2012-09-20 DIAGNOSIS — O365990 Maternal care for other known or suspected poor fetal growth, unspecified trimester, not applicable or unspecified: Secondary | ICD-10-CM

## 2012-09-20 DIAGNOSIS — IMO0002 Reserved for concepts with insufficient information to code with codable children: Secondary | ICD-10-CM

## 2012-09-20 LAB — US OB DETAIL + 14 WK

## 2012-09-20 NOTE — Progress Notes (Signed)
Doing well 

## 2012-09-21 ENCOUNTER — Encounter: Payer: Self-pay | Admitting: Obstetrics & Gynecology

## 2012-09-22 ENCOUNTER — Other Ambulatory Visit: Payer: Medicaid Other

## 2012-09-22 ENCOUNTER — Encounter: Payer: Self-pay | Admitting: Obstetrics & Gynecology

## 2012-09-22 ENCOUNTER — Ambulatory Visit (INDEPENDENT_AMBULATORY_CARE_PROVIDER_SITE_OTHER): Payer: Medicaid Other | Admitting: Obstetrics & Gynecology

## 2012-09-22 VITALS — BP 120/78 | Temp 99.4°F | Wt 288.0 lb

## 2012-09-22 DIAGNOSIS — Z3400 Encounter for supervision of normal first pregnancy, unspecified trimester: Secondary | ICD-10-CM

## 2012-09-22 DIAGNOSIS — Z3403 Encounter for supervision of normal first pregnancy, third trimester: Secondary | ICD-10-CM

## 2012-09-22 LAB — POCT URINALYSIS DIPSTICK
Bilirubin, UA: NEGATIVE
Ketones, UA: NEGATIVE
Spec Grav, UA: >=1.03
pH, UA: 5

## 2012-09-22 NOTE — Progress Notes (Signed)
CBG 112.  

## 2012-09-22 NOTE — Patient Instructions (Signed)
Intrauterine Growth Restriction Intrauterine growth restriction (IUGR) means that the baby is smaller than normal at the time of the pregnancy or at birth. This should not be confused with Small for Gestational Age (SGA), which means the baby's weight at birth is at the lower end (less than 10%) of normal birth weights.  CAUSES  Medical problems with the mother:  High blood pressure.  Kidney, lung or heart disease.  Diabetes with arteriosclerosis.  Hemoglobinoathies- blood diseases.  Antiphospholipid antibody syndrome - a disorder of the immune system. Other causes:  Smoking, drug abuse and excessive alcohol drinking.  Diseases of the placenta.  Having twins or more.  Malnutrition.  Infections.  Genetic problems.  Pregnant women 7 years old or younger and pregnant women 25 years old or older.  Exposure to toxic chemicals. SYMPTOMS  Smaller than normal uterus when measuring the uterine size on the abdomen.  Ultrasound measurements of the fetuses head circumference, the abdominal circumference, the diameter of the biparietal area (sides) of the head and the length of the femur less than normal indicating IUGR. RISKS AND COMPLICATIONS  Fetal death in the uterus or a stillborn baby.  Not having enough fluid in the baby's sac (oligohydramnios).  Fetal heart rate problems. This leads to more Cesarean Section deliveries.  Low Apgar scores (evaluates the baby's condition at birth).  Increase in the acidity of the baby's blood (acidosis). SGA babies can also have complications such as:  Low blood sugar.  Increase of bilirubin in the blood.  Low body temperature (hypothermia)  Low Apgar scores, convulsions, fetal death and stillborn. TREATMENT   Close following and monitoring of the fetus during the pregnancy.  Treat infections that may be present.  Treat and control the medical disease present.  Look at the condition of the fetus with non-tress tests,  contraction stress tests and biophysical profile of the fetus.  Doppler ultrasound (measure the umbilical artery blood flow) lowers the risk of fetal death and stillborn by delivering the baby early if it is abnormal. HOME CARE INSTRUCTIONS   Follow your caregiver's advice, instructions and keep all of your prenatal appointments.  Get plenty of rest and sleep.  Eat a balanced diet and take all your vitamin and mineral supplements.  Do not over use your energy with hard exercise, work and household activities.  Do not exercise unless your caregiver says it is OK to do so.  Do not smoke, drink alcohol or take illegal drugs.  Avoid chemicals like pesticides. SEEK MEDICAL CARE IF:   You develop a temperature of 100 F (37.8 C) or higher.  You do not feel the baby moving as much or not at all.  You develop leaking of fluid from the vagina.  You develop vaginal bleeding.  You develop abdominal pain.  You develop uterine contractions. Document Released: 02/11/2008 Document Revised: 07/27/2011 Document Reviewed: 02/11/2008 [facility] Patient Information 2013 [place], [place]. Labor Induction  Most women go into labor on their own between 70 and 42 weeks of the pregnancy. When this does not happen or when there is a medical need, medicine or other methods may be used to induce labor. Labor induction causes a pregnant woman's uterus to contract. It also causes the cervix to soften (ripen), open (dilate), and thin out (efface). Usually, labor is not induced before 39 weeks of the pregnancy unless there is a problem with the baby or mother. Whether your labor will be induced depends on a number of factors, including the following:  The  medical condition of you and the baby.  How many weeks along you are.  The status of baby's lung maturity.  The condition of the cervix.  The position of the baby. REASONS FOR LABOR INDUCTION  The health of the baby or mother is at risk.  The  pregnancy is overdue by 1 week or more.  The water breaks but labor does not start on its own.  The mother has a health condition or serious illness such as high blood pressure, infection, placental abruption, or diabetes.  The amniotic fluid amounts are low around the baby.  The baby is distressed. REASONS TO NOT INDUCE LABOR Labor induction may not be a good idea if:  It is shown that your baby does not tolerate labor.  An induction is just more convenient.  You want the baby to be born on a certain date, like a holiday.  You have had previous surgeries on your uterus, such as a myomectomy or the removal of fibroids.  Your placenta lies very low in the uterus and blocks the opening of the cervix (placenta previa).  Your baby is not in a head down position.  The umbilical cord drops down into the birth canal in front of the baby. This could cut off the baby's blood and oxygen supply.  You have had a previous cesarean delivery.  There areunusual circumstances, such as the baby being extremely premature. RISKS AND COMPLICATIONS Problems may occur in the process of induction and plans may need to be modified as a situation unfolds. Some of the risks of induction include:  Change in fetal heart rate, such as too high, too low, or erratic.  Risk of fetal distress.  Risk of infection to mother and baby.  Increased chance of having a cesarean delivery.  The rare, but increased chance that the placenta will separate from the uterus (abruption).  Uterine rupture (very rare). When induction is needed for medical reasons, the benefits of induction may outweigh the risks. BEFORE THE PROCEDURE Your caregiver will check your cervix and the baby's position. This will help your caregiver decide if you are far enough along for an induction to work. PROCEDURE Several methods of labor induction may be used, such as:   Taking prostaglandin medicine to dilate and ripen the cervix. The  medicine will also start contractions. It can be taken by mouth or by inserting a suppository into the vagina.  A thin tube (catheter) with a balloon on the end may be inserted into your vagina to dilate the cervix. Once inserted, the balloon expands with water, which causes the cervix to open.  Striping the membranes. Your caregiver inserts a finger between the cervix and membranes, which causes the cervix to be stretched and may cause the uterus to contract. This is often done during an office visit. You will be sent home to wait for the contractions to begin. You will then come in for an induction.  Breaking the water. Your caregiver will make a hole in the amniotic sac using a small instrument. Once the amniotic sac breaks, contractions should begin. This may still take hours to see an effect.  Taking medicine to trigger or strengthen contractions. This medicine is given intravenously through a tube in your arm. All of the methods of induction, besides stripping the membranes, will be done in the hospital. Induction is done in the hospital so that you and the baby can be carefully monitored. AFTER THE PROCEDURE Some inductions can take up to  2 or 3 days. Depending on the cervix, it usually takes less time. It takes longer when you are induced early in the pregnancy or if this is your first pregnancy. If a mother is still pregnant and the induction has been going on for 2 to 3 days, either the mother will be sent home or a cesarean delivery will be needed. Document Released: 09/23/2006 Document Revised: 07/27/2011 Document Reviewed: 03/09/2011 [facility] Patient Information 2013 [place], [place].

## 2012-09-22 NOTE — Progress Notes (Signed)
Doing well 

## 2012-09-22 NOTE — Progress Notes (Signed)
P 98 Patient states she may have felt a small contraction today

## 2012-09-23 ENCOUNTER — Other Ambulatory Visit: Payer: Self-pay | Admitting: *Deleted

## 2012-09-26 ENCOUNTER — Encounter (HOSPITAL_COMMUNITY): Payer: Self-pay | Admitting: *Deleted

## 2012-09-26 ENCOUNTER — Ambulatory Visit (INDEPENDENT_AMBULATORY_CARE_PROVIDER_SITE_OTHER): Payer: Medicaid Other | Admitting: Obstetrics

## 2012-09-26 ENCOUNTER — Telehealth (HOSPITAL_COMMUNITY): Payer: Self-pay | Admitting: *Deleted

## 2012-09-26 ENCOUNTER — Ambulatory Visit: Payer: Medicaid Other

## 2012-09-26 VITALS — BP 124/73 | Temp 97.3°F | Wt 290.0 lb

## 2012-09-26 DIAGNOSIS — E669 Obesity, unspecified: Secondary | ICD-10-CM

## 2012-09-26 DIAGNOSIS — O365990 Maternal care for other known or suspected poor fetal growth, unspecified trimester, not applicable or unspecified: Secondary | ICD-10-CM

## 2012-09-26 DIAGNOSIS — Z3400 Encounter for supervision of normal first pregnancy, unspecified trimester: Secondary | ICD-10-CM

## 2012-09-26 DIAGNOSIS — Z3403 Encounter for supervision of normal first pregnancy, third trimester: Secondary | ICD-10-CM

## 2012-09-26 DIAGNOSIS — IMO0002 Reserved for concepts with insufficient information to code with codable children: Secondary | ICD-10-CM

## 2012-09-26 LAB — POCT URINALYSIS DIPSTICK
Bilirubin, UA: NEGATIVE
Ketones, UA: NEGATIVE
pH, UA: 5

## 2012-09-26 NOTE — Progress Notes (Signed)
Pulse-84 No complaints 

## 2012-09-26 NOTE — Telephone Encounter (Signed)
Preadmission screen  

## 2012-09-29 ENCOUNTER — Encounter: Payer: Self-pay | Admitting: Obstetrics & Gynecology

## 2012-09-29 ENCOUNTER — Other Ambulatory Visit: Payer: Medicaid Other

## 2012-09-29 ENCOUNTER — Ambulatory Visit (INDEPENDENT_AMBULATORY_CARE_PROVIDER_SITE_OTHER): Payer: Medicaid Other | Admitting: Obstetrics & Gynecology

## 2012-09-29 VITALS — BP 124/80 | Temp 98.4°F | Wt 292.0 lb

## 2012-09-29 DIAGNOSIS — Z3400 Encounter for supervision of normal first pregnancy, unspecified trimester: Secondary | ICD-10-CM

## 2012-09-29 DIAGNOSIS — Z3403 Encounter for supervision of normal first pregnancy, third trimester: Secondary | ICD-10-CM

## 2012-09-29 DIAGNOSIS — O365990 Maternal care for other known or suspected poor fetal growth, unspecified trimester, not applicable or unspecified: Secondary | ICD-10-CM

## 2012-09-29 DIAGNOSIS — IMO0002 Reserved for concepts with insufficient information to code with codable children: Secondary | ICD-10-CM

## 2012-09-29 LAB — POCT URINALYSIS DIPSTICK
Bilirubin, UA: NEGATIVE
Nitrite, UA: NEGATIVE

## 2012-09-29 NOTE — Progress Notes (Signed)
Doing well 

## 2012-09-29 NOTE — Progress Notes (Signed)
Pulse- 101 

## 2012-09-29 NOTE — Patient Instructions (Signed)
Normal Labor and Delivery  Your caregiver must first be sure you are in labor. Signs of labor include:  · You may pass what is called "the mucus plug" before labor begins. This is a small amount of blood stained mucus.  · Regular uterine contractions.  · The time between contractions get closer together.  · The discomfort and pain gradually gets more intense.  · Pains are mostly located in the back.  · Pains get worse when walking.  · The cervix (the opening of the uterus becomes thinner (begins to efface) and opens up (dilates).  Once you are in labor and admitted into the hospital or care center, your caregiver will do the following:  · A complete physical examination.  · Check your vital signs (blood pressure, pulse, temperature and the fetal heart rate).  · Do a vaginal examination (using a sterile glove and lubricant) to determine:  · The position (presentation) of the baby (head [vertex] or buttock first).  · The level (station) of the baby's head in the birth canal.  · The effacement and dilatation of the cervix.  · You may have your pubic hair shaved and be given an enema depending on your caregiver and the circumstance.  · An electronic monitor is usually placed on your abdomen. The monitor follows the length and intensity of the contractions, as well as the baby's heart rate.  · Usually, your caregiver will insert an IV in your arm with a bottle of sugar water. This is done as a precaution so that medications can be given to you quickly during labor or delivery.  NORMAL LABOR AND DELIVERY IS DIVIDED UP INTO 3 STAGES:  First Stage  This is when regular contractions begin and the cervix begins to efface and dilate. This stage can last from 3 to 15 hours. The end of the first stage is when the cervix is 100% effaced and 10 centimeters dilated. Pain medications may be given by   · Injection (morphine, demerol, etc.)  · Regional anesthesia (spinal, caudal or epidural, anesthetics given in different locations of  the spine). Paracervical pain medication may be given, which is an injection of and anesthetic on each side of the cervix.  A pregnant woman may request to have "Natural Childbirth" which is not to have any medications or anesthesia during her labor and delivery.  Second Stage  This is when the baby comes down through the birth canal (vagina) and is born. This can take 1 to 4 hours. As the baby's head comes down through the birth canal, you may feel like you are going to have a bowel movement. You will get the urge to bear down and push until the baby is delivered. As the baby's head is being delivered, the caregiver will decide if an episiotomy (a cut in the perineum and vagina area) is needed to prevent tearing of the tissue in this area. The episiotomy is sewn up after the delivery of the baby and placenta. Sometimes a mask with nitrous oxide is given for the mother to breath during the delivery of the baby to help if there is too much pain. The end of Stage 2 is when the baby is fully delivered. Then when the umbilical cord stops pulsating it is clamped and cut.  Third Stage  The third stage begins after the baby is completely delivered and ends after the placenta (afterbirth) is delivered. This usually takes 5 to 30 minutes. After the placenta is delivered, a medication   is given either by intravenous or injection to help contract the uterus and prevent bleeding. The third stage is not painful and pain medication is usually not necessary. If an episiotomy was done, it is repaired at this time.  After the delivery, the mother is watched and monitored closely for 1 to 2 hours to make sure there is no postpartum bleeding (hemorrhage). If there is a lot of bleeding, medication is given to contract the uterus and stop the bleeding.  Document Released: 02/11/2008 Document Revised: 07/27/2011 Document Reviewed: 02/11/2008  [employer]® Patient Information ©2013 [employer].

## 2012-10-01 ENCOUNTER — Encounter (HOSPITAL_COMMUNITY): Payer: Self-pay

## 2012-10-01 ENCOUNTER — Inpatient Hospital Stay (HOSPITAL_COMMUNITY)
Admission: RE | Admit: 2012-10-01 | Discharge: 2012-10-04 | DRG: 775 | Disposition: A | Discharge status: Home / Self Care | Payer: Medicaid Other | Source: Ambulatory Visit | Attending: Obstetrics & Gynecology | Admitting: Obstetrics & Gynecology

## 2012-10-01 DIAGNOSIS — E669 Obesity, unspecified: Secondary | ICD-10-CM

## 2012-10-01 DIAGNOSIS — Z3403 Encounter for supervision of normal first pregnancy, third trimester: Secondary | ICD-10-CM

## 2012-10-01 DIAGNOSIS — IMO0002 Reserved for concepts with insufficient information to code with codable children: Secondary | ICD-10-CM

## 2012-10-01 DIAGNOSIS — O365990 Maternal care for other known or suspected poor fetal growth, unspecified trimester, not applicable or unspecified: Principal | ICD-10-CM | POA: Diagnosis present

## 2012-10-01 LAB — CBC
HCT: 32.7 % — ABNORMAL LOW (ref 36.0–46.0)
Hemoglobin: 10.8 g/dL — ABNORMAL LOW (ref 12.0–15.0)
MCH: 28.6 pg (ref 26.0–34.0)
MCHC: 33 g/dL (ref 30.0–36.0)

## 2012-10-01 MED ORDER — LACTATED RINGERS IV SOLN: 500.0000 mL | INTRAVENOUS | Status: DC | PRN

## 2012-10-01 MED ORDER — OXYTOCIN BOLUS FROM INFUSION: 500.0000 mL | INTRAVENOUS | Status: DC

## 2012-10-01 MED ORDER — IBUPROFEN 600 MG PO TABS
600.0000 mg | ORAL_TABLET | Freq: Four times a day (QID) | ORAL | Status: DC | PRN
  Administered 2012-10-02: 600 mg via ORAL
  Filled 2012-10-01: qty 1

## 2012-10-01 MED ORDER — CITRIC ACID-SODIUM CITRATE 334-500 MG/5ML PO SOLN
30.0000 mL | ORAL | Status: DC | PRN
  Filled 2012-10-01: qty 15

## 2012-10-01 MED ORDER — HYDROXYZINE HCL 50 MG PO TABS
50.0000 mg | ORAL_TABLET | Freq: Four times a day (QID) | ORAL | Status: DC | PRN
  Filled 2012-10-01: qty 1

## 2012-10-01 MED ORDER — TERBUTALINE SULFATE 1 MG/ML IJ SOLN: 0.2500 mg | Freq: Once | INTRAMUSCULAR | Status: AC | PRN

## 2012-10-01 MED ORDER — LACTATED RINGERS IV SOLN
INTRAVENOUS | Status: DC
  Administered 2012-10-01 – 2012-10-02 (×3): via INTRAVENOUS

## 2012-10-01 MED ORDER — LIDOCAINE HCL (PF) 1 % IJ SOLN
30.0000 mL | INTRAMUSCULAR | Status: AC | PRN
  Administered 2012-10-02: 30 mL via SUBCUTANEOUS
  Filled 2012-10-01 (×3): qty 30

## 2012-10-01 MED ORDER — BUTORPHANOL TARTRATE 1 MG/ML IJ SOLN
1.0000 mg | INTRAMUSCULAR | Status: DC | PRN
  Administered 2012-10-02 (×4): 1 mg via INTRAVENOUS
  Filled 2012-10-01 (×4): qty 1

## 2012-10-01 MED ORDER — ACETAMINOPHEN 325 MG PO TABS: 650.0000 mg | ORAL_TABLET | ORAL | Status: DC | PRN

## 2012-10-01 MED ORDER — ZOLPIDEM TARTRATE 5 MG PO TABS
5.0000 mg | ORAL_TABLET | Freq: Every evening | ORAL | Status: DC | PRN
  Administered 2012-10-02: 5 mg via ORAL
  Filled 2012-10-01: qty 1

## 2012-10-01 MED ORDER — MISOPROSTOL 25 MCG QUARTER TABLET
25.0000 ug | ORAL_TABLET | ORAL | Status: DC | PRN
  Administered 2012-10-01 – 2012-10-02 (×2): 25 ug via VAGINAL
  Filled 2012-10-01: qty 1
  Filled 2012-10-01 (×2): qty 0.25

## 2012-10-01 MED ORDER — OXYCODONE-ACETAMINOPHEN 5-325 MG PO TABS: 1.0000 | ORAL_TABLET | ORAL | Status: DC | PRN

## 2012-10-01 MED ORDER — OXYTOCIN 40 UNITS IN LACTATED RINGERS INFUSION - SIMPLE MED: 62.5000 mL/h | INTRAVENOUS | Status: DC

## 2012-10-01 MED ORDER — ONDANSETRON HCL 4 MG/2ML IJ SOLN: 4.0000 mg | Freq: Four times a day (QID) | INTRAMUSCULAR | Status: DC | PRN

## 2012-10-02 ENCOUNTER — Inpatient Hospital Stay (HOSPITAL_COMMUNITY): Payer: Medicaid Other | Admitting: Anesthesiology

## 2012-10-02 ENCOUNTER — Encounter (HOSPITAL_COMMUNITY): Payer: Self-pay | Admitting: Anesthesiology

## 2012-10-02 ENCOUNTER — Encounter (HOSPITAL_COMMUNITY): Payer: Self-pay

## 2012-10-02 MED ORDER — TETANUS-DIPHTH-ACELL PERTUSSIS 5-2.5-18.5 LF-MCG/0.5 IM SUSP
0.5000 mL | Freq: Once | INTRAMUSCULAR | Status: AC
  Administered 2012-10-03: 0.5 mL via INTRAMUSCULAR
  Filled 2012-10-02: qty 0.5

## 2012-10-02 MED ORDER — EPHEDRINE 5 MG/ML INJ
10.0000 mg | INTRAVENOUS | Status: DC | PRN
  Filled 2012-10-02: qty 4
  Filled 2012-10-02: qty 2

## 2012-10-02 MED ORDER — PHENYLEPHRINE 40 MCG/ML (10ML) SYRINGE FOR IV PUSH (FOR BLOOD PRESSURE SUPPORT)
80.0000 ug | PREFILLED_SYRINGE | INTRAVENOUS | Status: DC | PRN
  Filled 2012-10-02: qty 2

## 2012-10-02 MED ORDER — DIPHENHYDRAMINE HCL 25 MG PO CAPS: 25.0000 mg | ORAL_CAPSULE | Freq: Four times a day (QID) | ORAL | Status: DC | PRN

## 2012-10-02 MED ORDER — BENZOCAINE-MENTHOL 20-0.5 % EX AERO: 1.0000 "application " | INHALATION_SPRAY | CUTANEOUS | Status: DC | PRN

## 2012-10-02 MED ORDER — DIPHENHYDRAMINE HCL 50 MG/ML IJ SOLN
12.5000 mg | INTRAMUSCULAR | Status: DC | PRN
Start: 2012-10-02 — End: 2012-10-02

## 2012-10-02 MED ORDER — TERBUTALINE SULFATE 1 MG/ML IJ SOLN: 0.2500 mg | Freq: Once | INTRAMUSCULAR | Status: DC | PRN

## 2012-10-02 MED ORDER — FENTANYL 2.5 MCG/ML BUPIVACAINE 1/10 % EPIDURAL INFUSION (WH - ANES): 14.0000 mL/h | INTRAMUSCULAR | Status: DC | PRN

## 2012-10-02 MED ORDER — EPHEDRINE 5 MG/ML INJ: 10.0000 mg | INTRAVENOUS | Status: DC | PRN

## 2012-10-02 MED ORDER — DIBUCAINE 1 % RE OINT: 1.0000 "application " | TOPICAL_OINTMENT | RECTAL | Status: DC | PRN

## 2012-10-02 MED ORDER — MEASLES, MUMPS & RUBELLA VAC ~~LOC~~ INJ: 0.5000 mL | INJECTION | Freq: Once | SUBCUTANEOUS | Status: DC

## 2012-10-02 MED ORDER — FENTANYL 2.5 MCG/ML BUPIVACAINE 1/10 % EPIDURAL INFUSION (WH - ANES)
14.0000 mL/h | INTRAMUSCULAR | Status: DC | PRN
  Filled 2012-10-02: qty 125

## 2012-10-02 MED ORDER — LACTATED RINGERS IV SOLN: 500.0000 mL | Freq: Once | INTRAVENOUS | Status: DC

## 2012-10-02 MED ORDER — SENNOSIDES-DOCUSATE SODIUM 8.6-50 MG PO TABS
2.0000 | ORAL_TABLET | Freq: Every day | ORAL | Status: DC
  Administered 2012-10-02 – 2012-10-03 (×2): 2 via ORAL

## 2012-10-02 MED ORDER — EPHEDRINE 5 MG/ML INJ
10.0000 mg | INTRAVENOUS | Status: DC | PRN
  Filled 2012-10-02: qty 2

## 2012-10-02 MED ORDER — OXYTOCIN 40 UNITS IN LACTATED RINGERS INFUSION - SIMPLE MED
1.0000 m[IU]/min | INTRAVENOUS | Status: DC
  Administered 2012-10-02: 2 m[IU]/min via INTRAVENOUS
  Filled 2012-10-02: qty 1000

## 2012-10-02 MED ORDER — SODIUM BICARBONATE 8.4 % IV SOLN
INTRAVENOUS | Status: DC | PRN
  Administered 2012-10-02: 5 mL via EPIDURAL

## 2012-10-02 MED ORDER — ZOLPIDEM TARTRATE 5 MG PO TABS: 5.0000 mg | ORAL_TABLET | Freq: Every evening | ORAL | Status: DC | PRN

## 2012-10-02 MED ORDER — PHENYLEPHRINE 40 MCG/ML (10ML) SYRINGE FOR IV PUSH (FOR BLOOD PRESSURE SUPPORT): 80.0000 ug | PREFILLED_SYRINGE | INTRAVENOUS | Status: DC | PRN

## 2012-10-02 MED ORDER — WITCH HAZEL-GLYCERIN EX PADS: 1.0000 "application " | MEDICATED_PAD | CUTANEOUS | Status: DC | PRN

## 2012-10-02 MED ORDER — FERROUS SULFATE 325 (65 FE) MG PO TABS
325.0000 mg | ORAL_TABLET | Freq: Two times a day (BID) | ORAL | Status: DC
  Administered 2012-10-03 – 2012-10-04 (×3): 325 mg via ORAL
  Filled 2012-10-02 (×3): qty 1

## 2012-10-02 MED ORDER — PRENATAL MULTIVITAMIN CH
1.0000 | ORAL_TABLET | Freq: Every day | ORAL | Status: DC
  Administered 2012-10-03 – 2012-10-04 (×2): 1 via ORAL
  Filled 2012-10-02 (×2): qty 1

## 2012-10-02 MED ORDER — LANOLIN HYDROUS EX OINT: TOPICAL_OINTMENT | CUTANEOUS | Status: DC | PRN

## 2012-10-02 MED ORDER — IBUPROFEN 600 MG PO TABS
600.0000 mg | ORAL_TABLET | Freq: Four times a day (QID) | ORAL | Status: DC
  Administered 2012-10-02 – 2012-10-04 (×7): 600 mg via ORAL
  Filled 2012-10-02 (×7): qty 1

## 2012-10-02 MED ORDER — OXYCODONE-ACETAMINOPHEN 5-325 MG PO TABS: 1.0000 | ORAL_TABLET | ORAL | Status: DC | PRN

## 2012-10-02 MED ORDER — MAGNESIUM HYDROXIDE 400 MG/5ML PO SUSP: 30.0000 mL | ORAL | Status: DC | PRN

## 2012-10-02 MED ORDER — FENTANYL 2.5 MCG/ML BUPIVACAINE 1/10 % EPIDURAL INFUSION (WH - ANES)
14.0000 mL/h | INTRAMUSCULAR | Status: DC | PRN
  Administered 2012-10-02: 14 mL/h via EPIDURAL

## 2012-10-02 MED ORDER — MEDROXYPROGESTERONE ACETATE 150 MG/ML IM SUSP: 150.0000 mg | INTRAMUSCULAR | Status: DC | PRN

## 2012-10-02 MED ORDER — DIPHENHYDRAMINE HCL 50 MG/ML IJ SOLN: 12.5000 mg | INTRAMUSCULAR | Status: DC | PRN

## 2012-10-02 MED ORDER — ONDANSETRON HCL 4 MG/2ML IJ SOLN: 4.0000 mg | INTRAMUSCULAR | Status: DC | PRN

## 2012-10-02 MED ORDER — LACTATED RINGERS IV SOLN
INTRAVENOUS | Status: DC
  Administered 2012-10-02: 14:00:00 via INTRAUTERINE

## 2012-10-02 MED ORDER — ONDANSETRON HCL 4 MG PO TABS: 4.0000 mg | ORAL_TABLET | ORAL | Status: DC | PRN

## 2012-10-02 MED ORDER — PHENYLEPHRINE 40 MCG/ML (10ML) SYRINGE FOR IV PUSH (FOR BLOOD PRESSURE SUPPORT)
80.0000 ug | PREFILLED_SYRINGE | INTRAVENOUS | Status: DC | PRN
  Filled 2012-10-02: qty 2
  Filled 2012-10-02: qty 5

## 2012-10-02 NOTE — H&P (Signed)
Ariana Rice is a 27 y.o. female presenting for induction of labor. Maternal Medical History:  Reason for admission: For induction of labor secondary to IUGR--EFW <10%.  Fetal testing reassuring.  Fetal activity: Perceived fetal activity is normal.    Prenatal complications: IUGR.   Prenatal Complications - Diabetes: none.    OB History   Grav Para Term Preterm Abortions TAB SAB Ect Mult Living   1              Past Medical History  Diagnosis Date  . Anginal pain     intermittent- had cardiac workup- unknown cause   Past Surgical History  Procedure Laterality Date  . No past surgeries     Family History: family history includes Diabetes in her father and maternal grandmother; Hypertension in her mother; and Kidney disease in her maternal grandmother.  There is no history of Other. Social History:  reports that she has quit smoking. She has never used smokeless tobacco. She reports that she does not drink alcohol or use illicit drugs.     Review of Systems  Constitutional: Negative for fever.  Eyes: Negative for blurred vision.  Respiratory: Negative for shortness of breath.   Gastrointestinal: Negative for vomiting.  Skin: Negative for rash.  Neurological: Negative for headaches.    Dilation: 3 Effacement (%): 80 Station: -2 Exam by:: [email or web address], rn Blood pressure 111/22, pulse 102, temperature 98 F (36.7 C), temperature source Oral, resp. rate 20, height 5\' 7"  (1.702 m), weight 292 lb (132.45 kg), last menstrual period 01/02/2012, SpO2 97.00%. Maternal Exam:  Abdomen: Patient reports no abdominal tenderness. Fetal presentation: vertex  Introitus: not evaluated.   Pelvis: adequate for delivery.   Cervix: Cervix evaluated by digital exam.     Fetal Exam Fetal Monitor Review: Variability: moderate (6-25 bpm).   Pattern: accelerations present and no decelerations.    Fetal State Assessment: Category I - tracings are normal.     Physical Exam   Constitutional: She appears well-developed.  HENT:  Head: Normocephalic.  Neck: Neck supple. No thyromegaly present.  Cardiovascular: Normal rate and regular rhythm.   Respiratory: Breath sounds normal.  GI: Soft. Bowel sounds are normal.  Skin: No rash noted.    Prenatal labs: ABO, Rh: --/--/O POS (05/17 2000) Antibody: NEG (05/17 2000) Rubella: Immune (11/08 0000) RPR: NON REACTIVE (05/17 2000)  HBsAg: Negative (11/08 0000)  HIV: Non-reactive (11/08 0000)  GBS: Negative (04/23 0000)   Assessment/Plan: [place] @ [redacted]w[redacted]d w/IUGR, <10% for induction of labor. Category I FHT  Admit 2-stage induction of labor   JACKSON-MOORE,Clarity Ciszek A 10/02/2012, 10:56 AM

## 2012-10-02 NOTE — Progress Notes (Signed)
Ariana Rice is Rice 27 y.o. G1P0 at [redacted]w[redacted]d by LMP admitted for induction of labor due to Poor fetal growth.  Subjective: Comfortable  Objective: BP 117/48  Pulse 86  Temp(Src) 98 F (36.7 C) (Oral)  Resp 20  Ht 5\' 7"  (1.702 m)  Wt 292 lb (132.45 kg)  BMI 45.72 kg/m2  SpO2 98%  LMP 01/02/2012      FHT:  FHR: 140 bpm, variability: moderate,  accelerations:  Present,  decelerations:  Absent UC:   irregular, every 5 minutes SVE:   Dilation: 3 Effacement (%): 80 Station: -2 Exam by:: [employer]: Lab Results  Component Value Date   WBC 6.1 10/01/2012   HGB 10.8* 10/01/2012   HCT 32.7* 10/01/2012   MCV 86.5 10/01/2012   PLT 221 10/01/2012    Assessment / Plan: Early labor  Labor: Start Pitocin; goal 200 MVU Preeclampsia:  n/Rice Fetal Wellbeing:  Category I Pain Control:  Epidural I/D:  n/Rice Anticipated MOD:  NSVD  Ariana Rice 10/02/2012, 12:05 PM

## 2012-10-02 NOTE — Anesthesia Procedure Notes (Signed)
Epidural Patient location during procedure: OB  Preanesthetic Checklist Completed: patient identified, site marked, surgical consent, pre-op evaluation, timeout performed, IV checked, risks and benefits discussed and monitors and equipment checked  Epidural Patient position: sitting Prep: site prepped and draped and DuraPrep Patient monitoring: continuous pulse ox and blood pressure Approach: midline Injection technique: LOR air  Needle:  Needle type: Tuohy  Needle gauge: 17 G Needle length: 9 cm and 9 Needle insertion depth: 7 cm Catheter type: closed end flexible Catheter size: 19 Gauge Catheter at skin depth: 15 cm Test dose: negative  Assessment Events: blood not aspirated, injection not painful, no injection resistance, negative IV test and no paresthesia  Additional Notes Dosing of Epidural:  1st dose, through catheter ............................................. epi 1:200K + Xylocaine 40 mg  2nd dose, through catheter, after waiting 3 minutes.....epi 1:200K + Xylocaine 60 mg    ( 2% Xylo charted as a single dose in Epic Meds for ease of charting; actual dosing was fractionated as above, for saftey's sake)  As each dose occurred, patient was free of IV sx; and patient exhibited no evidence of SA injection.  Patient is more comfortable after epidural dosed. Please see RN's note for documentation of vital signs,and FHR which are stable.  Patient reminded not to try to ambulate with numb legs, and that an RN must be present when she attempts to get up.       

## 2012-10-02 NOTE — Anesthesia Preprocedure Evaluation (Signed)
Anesthesia Evaluation  Patient identified by MRN, date of birth, ID band Patient awake    Reviewed: Allergy & Precautions, H&P , Patient's Chart, lab work & pertinent test results  Airway Mallampati: III  TM Distance: >3 FB Neck ROM: full    Dental  (+) Teeth Intact   Pulmonary  breath sounds clear to auscultation        Cardiovascular Rhythm:regular Rate:Normal     Neuro/Psych    GI/Hepatic   Endo/Other  Morbid obesity  Renal/GU      Musculoskeletal   Abdominal   Peds  Hematology   Anesthesia Other Findings       Reproductive/Obstetrics (+) Pregnancy                             Anesthesia Physical Anesthesia Plan  ASA: III  Anesthesia Plan: Epidural   Post-op Pain Management:    Induction:   Airway Management Planned:   Additional Equipment:   Intra-op Plan:   Post-operative Plan:   Informed Consent: I have reviewed the patients History and Physical, chart, labs and discussed the procedure including the risks, benefits and alternatives for the proposed anesthesia with the patient or authorized representative who has indicated his/her understanding and acceptance.   Dental Advisory Given  Plan Discussed with:   Anesthesia Plan Comments: (Labs checked- platelets confirmed with RN in room. Fetal heart tracing, per RN, reported to be stable enough for sitting procedure. Discussed epidural, and patient consents to the procedure:  included risk of possible headache,backache, failed block, allergic reaction, and nerve injury. This patient was asked if she had any questions or concerns before the procedure started.)        Anesthesia Quick Evaluation  

## 2012-10-02 NOTE — Progress Notes (Signed)
Amnioinfusion bolus started at 13:50. Line occluded and flush attempted unsuccessfully. Bolus attempted at slower rate and again unsuccessful. MD notified to come replace IUPC.

## 2012-10-03 ENCOUNTER — Other Ambulatory Visit: Payer: Medicaid Other

## 2012-10-03 NOTE — Lactation Note (Signed)
This note was copied from the chart of Ariana Chelan Heringer. Lactation Consultation Note  Patient Name: Ariana Rice FAOZH'Y Date: 10/03/2012 Reason for consult: Initial assessment   Maternal Data Formula Feeding for Exclusion: Yes Reason for exclusion: Mother's choice to formula and breast feed on admission Infant to breast within first hour of birth: No Does the patient have breastfeeding experience prior to this delivery?: No  Feeding Feeding Type: Breast Milk Feeding method: Breast  LATCH Score/Interventions Latch: Too sleepy or reluctant, no latch achieved, no sucking elicited.  Audible Swallowing: None  Type of Nipple: Everted at rest and after stimulation  Comfort (Breast/Nipple): Soft / non-tender     Hold (Positioning): Assistance needed to correctly position infant at breast and maintain latch. Intervention(s): Breastfeeding basics reviewed;Support Pillows;Skin to skin  LATCH Score: 5  Lactation Tools Discussed/Used     Consult Status Consult Status: Follow-up Date: 10/04/12 Follow-up type: In-patient  Assisted mom with latch. She reports he takes a few sucks then falls asleep. Unwrapped and placed in football hold. Baby sucking tongue some. Encouraged mom to let him suck on her finger to practice sucking and get in a good rhythm before latching.Did take a few sucks then off to sleep. Placed skin to skin with mom.Asking about pumping and bottle feeding. Encouraged to continue trying to nurse baby as he is less than 24 hours old. Reviewed cluster feeding and encouraged to take nap this afternoon. BF brochure given with resources for support after DC. No questions at present. Encouraged to page for assist prn.  Pamelia Hoit 10/03/2012, 12:11 PM

## 2012-10-03 NOTE — Anesthesia Postprocedure Evaluation (Signed)
  Anesthesia Post-op Note  Patient: Ariana Rice  Procedure(s) Performed: * No procedures listed *  Patient Location: Mother/Baby  Anesthesia Type:Epidural  Level of Consciousness: awake, alert , oriented and patient cooperative  Airway and Oxygen Therapy: Patient Spontanous Breathing  Post-op Pain: mild  Post-op Assessment: Patient's Cardiovascular Status Stable, Respiratory Function Stable, No headache, No backache, No residual numbness and No residual motor weakness  Post-op Vital Signs: stable  Complications: No apparent anesthesia complications

## 2012-10-03 NOTE — Progress Notes (Signed)
Post Partum Day 1 Subjective: no complaints  Objective: Blood pressure 97/61, pulse 99, temperature 98.7 F (37.1 C), temperature source Oral, resp. rate 20, height 5\' 7"  (1.702 m), weight 292 lb (132.45 kg), last menstrual period 01/02/2012, SpO2 96.00%, unknown if currently breastfeeding.  Physical Exam:  General: alert and no distress Lochia: appropriate Uterine Fundus: firm Incision: healing well DVT Evaluation: No evidence of DVT seen on physical exam.   Recent Labs  10/01/12 2000 10/03/12 0630  HGB 10.8* 9.2*  HCT 32.7* 27.0*    Assessment/Plan: Plan for discharge tomorrow   LOS: 2 days   HARPER,CHARLES A 10/03/2012, 8:27 AM

## 2012-10-03 NOTE — Progress Notes (Signed)
UR chart review completed.  

## 2012-10-04 MED ORDER — IBUPROFEN 600 MG PO TABS: 600.0000 mg | ORAL_TABLET | Freq: Four times a day (QID) | ORAL | Status: DC | PRN

## 2012-10-04 MED ORDER — OXYCODONE-ACETAMINOPHEN 5-325 MG PO TABS: 1.0000 | ORAL_TABLET | ORAL | Status: DC | PRN

## 2012-10-04 MED ORDER — FUSION PLUS PO CAPS: 1.0000 | ORAL_CAPSULE | Freq: Every day | ORAL | Status: DC

## 2012-10-04 NOTE — Discharge Summary (Signed)
Obstetric Discharge Summary Reason for Admission: induction of labor Prenatal Procedures: NST and ultrasound Intrapartum Procedures: spontaneous vaginal delivery Postpartum Procedures: none Complications-Operative and Postpartum: none Hemoglobin  Date Value Range Status  10/03/2012 9.2* 12.0 - 15.0 g/dL Final  16/05/958 45.4   Final     HCT  Date Value Range Status  10/03/2012 27.0* 36.0 - 46.0 % Final  03/25/2012 34   Final    Physical Exam:  General: alert and no distress Lochia: appropriate Uterine Fundus: firm Incision: healing well DVT Evaluation: No evidence of DVT seen on physical exam.  Discharge Diagnoses: Term Pregnancy-delivered  Discharge Information: Date: 10/04/2012 Activity: pelvic rest Diet: routine Medications: PNV, Ibuprofen, Colace, Iron and Percocet Condition: stable Instructions: refer to practice specific booklet Discharge to: home Follow-up Information   Follow up with Antionette Char A, MD In 6 weeks.   Contact information:   [address] [place] [address] [phone]       Newborn Data: Live born female  Birth Weight: 5 lb 15.9 oz (2720 g) APGAR: 9, 9  Home with mother.  Armida Vickroy A 10/04/2012, 8:39 AM

## 2012-10-04 NOTE — Progress Notes (Signed)
Post Partum Day 2 Subjective: no complaints  Objective: Blood pressure 109/71, pulse 92, temperature 98.5 F (36.9 C), temperature source Oral, resp. rate 18, height 5\' 7"  (1.702 m), weight 292 lb (132.45 kg), last menstrual period 01/02/2012, SpO2 96.00%, unknown if currently breastfeeding.  Physical Exam:  General: alert and no distress Lochia: appropriate Uterine Fundus: firm Incision: healing well DVT Evaluation: No evidence of DVT seen on physical exam.   Recent Labs  10/01/12 2000 10/03/12 0630  HGB 10.8* 9.2*  HCT 32.7* 27.0*    Assessment/Plan: Discharge home   LOS: 3 days   HARPER,CHARLES A 10/04/2012, 8:33 AM

## 2012-10-06 ENCOUNTER — Encounter: Payer: Medicaid Other | Admitting: Obstetrics & Gynecology

## 2012-10-06 ENCOUNTER — Other Ambulatory Visit: Payer: Medicaid Other

## 2012-10-06 ENCOUNTER — Encounter: Payer: Self-pay | Admitting: Obstetrics

## 2012-10-08 ENCOUNTER — Inpatient Hospital Stay (HOSPITAL_COMMUNITY): Admission: AD | Admit: 2012-10-08 | Payer: Self-pay | Source: Ambulatory Visit | Admitting: Obstetrics

## 2012-10-17 ENCOUNTER — Ambulatory Visit (INDEPENDENT_AMBULATORY_CARE_PROVIDER_SITE_OTHER): Payer: Medicaid Other | Admitting: Obstetrics & Gynecology

## 2012-10-17 ENCOUNTER — Encounter: Payer: Self-pay | Admitting: Obstetrics & Gynecology

## 2012-10-17 MED ORDER — NORETHINDRONE 0.35 MG PO TABS: 1.0000 | ORAL_TABLET | Freq: Every day | ORAL | Status: DC

## 2012-10-17 NOTE — Progress Notes (Signed)
Subjective:     Ariana Rice is a 27 y.o. female who presents for a postpartum visit. She is 2 weeks postpartum following a spontaneous vaginal delivery. I have fully reviewed the prenatal and intrapartum course. The delivery was at 39 gestational weeks. Outcome: spontaneous vaginal delivery. Anesthesia: epidural. Postpartum course has been WNL. Baby's course has been WNL. Baby is feeding by breast. Bleeding brown. Bowel function is normal. Bladder function is normal. Patient is not sexually active. Contraception method is abstinence. Postpartum depression screening: negative.  The following portions of the patient's history were reviewed and updated as appropriate: allergies, current medications, past family history, past medical history, past social history, past surgical history and problem list.  Review of Systems Pertinent items are noted in HPI.   Objective:    BP 113/73  Pulse 81  Temp(Src) 98 F (36.7 C)  Wt 276 lb (125.193 kg)  BMI 43.22 kg/m2  Breastfeeding? Yes       No exam todat Assessment:     Doing well postpartum.   Plan:     Contraception: oral progesterone-only contraceptive  Follow up in: 4 weeks

## 2012-10-17 NOTE — Patient Instructions (Signed)
Contraception Choices  Contraception (birth control) is the use of any methods or devices to prevent pregnancy. Below are some methods to help avoid pregnancy.  HORMONAL METHODS   · Contraceptive implant. This is a thin, plastic tube containing progesterone hormone. It does not contain estrogen hormone. Your caregiver inserts the tube in the inner part of the upper arm. The tube can remain in place for up to 3 years. After 3 years, the implant must be removed. The implant prevents the ovaries from releasing an egg (ovulation), thickens the cervical mucus which prevents sperm from entering the uterus, and thins the lining of the inside of the uterus.  · Progesterone-only injections. These injections are given every 3 months by your caregiver to prevent pregnancy. This synthetic progesterone hormone stops the ovaries from releasing eggs. It also thickens cervical mucus and changes the uterine lining. This makes it harder for sperm to survive in the uterus.  · Birth control pills. These pills contain estrogen and progesterone hormone. They work by stopping the egg from forming in the ovary (ovulation). Birth control pills are prescribed by a caregiver. Birth control pills can also be used to treat heavy periods.  · Minipill. This type of birth control pill contains only the progesterone hormone. They are taken every day of each month and must be prescribed by your caregiver.  · Birth control patch. The patch contains hormones similar to those in birth control pills. It must be changed once a week and is prescribed by a caregiver.  · Vaginal ring. The ring contains hormones similar to those in birth control pills. It is left in the vagina for 3 weeks, removed for 1 week, and then a new one is put back in place. The patient must be comfortable inserting and removing the ring from the vagina. A caregiver's prescription is necessary.  · Emergency contraception. Emergency contraceptives prevent pregnancy after unprotected  sexual intercourse. This pill can be taken right after sex or up to 5 days after unprotected sex. It is most effective the sooner you take the pills after having sexual intercourse. Emergency contraceptive pills are available without a prescription. Check with your pharmacist. Do not use emergency contraception as your only form of birth control.  BARRIER METHODS   · Female condom. This is a thin sheath (latex or rubber) that is worn over the penis during sexual intercourse. It can be used with spermicide to increase effectiveness.  · Female condom. This is a soft, loose-fitting sheath that is put into the vagina before sexual intercourse.  · Diaphragm. This is a soft, latex, dome-shaped barrier that must be fitted by a caregiver. It is inserted into the vagina, along with a spermicidal jelly. It is inserted before intercourse. The diaphragm should be left in the vagina for 6 to 8 hours after intercourse.  · Cervical cap. This is a round, soft, latex or plastic cup that fits over the cervix and must be fitted by a caregiver. The cap can be left in place for up to 48 hours after intercourse.  · Sponge. This is a soft, circular piece of polyurethane foam. The sponge has spermicide in it. It is inserted into the vagina after wetting it and before sexual intercourse.  · Spermicides. These are chemicals that kill or block sperm from entering the cervix and uterus. They come in the form of creams, jellies, suppositories, foam, or tablets. They do not require a prescription. They are inserted into the vagina with an applicator before having sexual intercourse.   The process must be repeated every time you have sexual intercourse.  INTRAUTERINE CONTRACEPTION  · Intrauterine device (IUD). This is a T-shaped device that is put in a woman's uterus during a menstrual period to prevent pregnancy. There are 2 types:  · Copper IUD. This type of IUD is wrapped in copper wire and is placed inside the uterus. Copper makes the uterus and  fallopian tubes produce a fluid that kills sperm. It can stay in place for 10 years.  · Hormone IUD. This type of IUD contains the hormone progestin (synthetic progesterone). The hormone thickens the cervical mucus and prevents sperm from entering the uterus, and it also thins the uterine lining to prevent implantation of a fertilized egg. The hormone can weaken or kill the sperm that get into the uterus. It can stay in place for 5 years.  PERMANENT METHODS OF CONTRACEPTION  · Female tubal ligation. This is when the woman's fallopian tubes are surgically sealed, tied, or blocked to prevent the egg from traveling to the uterus.  · Female sterilization. This is when the female has the tubes that carry sperm tied off (vasectomy). This blocks sperm from entering the vagina during sexual intercourse. After the procedure, the man can still ejaculate fluid (semen).  NATURAL PLANNING METHODS  · Natural family planning. This is not having sexual intercourse or using a barrier method (condom, diaphragm, cervical cap) on days the woman could become pregnant.  · Calendar method. This is keeping track of the length of each menstrual cycle and identifying when you are fertile.  · Ovulation method. This is avoiding sexual intercourse during ovulation.  · Symptothermal method. This is avoiding sexual intercourse during ovulation, using a thermometer and ovulation symptoms.  · Post-ovulation method. This is timing sexual intercourse after you have ovulated.  Regardless of which type or method of contraception you choose, it is important that you use condoms to protect against the transmission of sexually transmitted diseases (STDs). Talk with your caregiver about which form of contraception is most appropriate for you.  Document Released: 05/04/2005 Document Revised: 07/27/2011 Document Reviewed: 09/10/2010  [employer]® Patient Information ©2014 [employer].

## 2012-10-17 NOTE — Addendum Note (Signed)
Addended by: Antionette Char on: 10/17/2012 02:22 PM   Modules accepted: Orders

## 2012-11-21 ENCOUNTER — Ambulatory Visit (INDEPENDENT_AMBULATORY_CARE_PROVIDER_SITE_OTHER): Payer: Medicaid Other | Admitting: Obstetrics & Gynecology

## 2012-11-21 ENCOUNTER — Encounter: Payer: Self-pay | Admitting: Obstetrics & Gynecology

## 2012-11-21 VITALS — BP 101/67 | HR 86 | Wt 270.0 lb

## 2012-11-21 DIAGNOSIS — N9089 Other specified noninflammatory disorders of vulva and perineum: Secondary | ICD-10-CM

## 2012-11-21 DIAGNOSIS — Z113 Encounter for screening for infections with a predominantly sexual mode of transmission: Secondary | ICD-10-CM

## 2012-11-21 NOTE — Progress Notes (Signed)
Subjective:     Ariana Rice is a 27 y.o. female who presents for a postpartum visit. She is 7 weeks postpartum following a spontaneous vaginal delivery. I have fully reviewed the prenatal and intrapartum course. The delivery was at 39 gestational weeks. Outcome: spontaneous vaginal delivery. Anesthesia: epidural. Postpartum course has been WNL. Baby's course has been WNL. Baby is feeding by bottle Rush Barer. Bleeding no bleeding. Bowel function is normal. Bladder function is normal. Patient is not sexually active. Contraception method is abstinence. Postpartum depression screening: negative.  The following portions of the patient's history were reviewed and updated as appropriate: allergies, current medications, past family history, past medical history, past social history, past surgical history and problem list.  Review of Systems Pertinent items are noted in HPI.   Objective:    BP 101/67  Pulse 86  Wt 270 lb (122.471 kg)  BMI 42.28 kg/m2  Breastfeeding? No  General:  alert     Abdomen: soft, non-tender; bowel sounds normal; no masses,  no organomegaly   Vulva:  left-sided perineal verrucous lesion--1 cm  Vagina: normal vagina  Cervix:  no lesions  Corpus: normal size, contour, position, consistency, mobility, non-tender  Adnexa:  normal adnexa       Written consent was obtained for a biopsy of the vulva.  A time-out was performed.  The area was prepped with Betadine.  The skin was infiltrated with 1 % lidocaine.  The lesion was excised with the scalpel.  The skin was closed with a figure-of-eight suture of 3-0 Vicryl.  Adequate hemostasis was noted. Assessment:     Normal postpartum exam.  ?HPV--vulva  Plan:    Contraception: IUD and Mirena insertion at next visit

## 2012-11-21 NOTE — Patient Instructions (Signed)
Vulva Biopsy Care After Refer to this sheet in the next few weeks. These instructions provide you with information on caring for yourself after your procedure. Your caregiver may also give you more specific instructions. Your treatment has been planned according to current medical practices, but problems sometimes occur. Call your caregiver if you have any problems or questions after your procedure.  HOME CARE INSTRUCTIONS  Only take over-the-counter or prescription medicines for pain or discomfort as directed by your caregiver.  Do not rub the biopsy area after urinating. Gently pat the area dry or use a bottle filled with warm water (peri-bottle) to clean the area. Gently wipe from front to back.  Clean the area using water and mild soap. Gently pat the area dry.  Check your biopsy site with a handheld mirror daily to be sure it is healing properly.  Do not have intercourse for 3 4 days or as directed by your caregiver.  Avoid exercise, such as running or biking, for 2 3 days or as directed by your caregiver.  You may shower after the procedure. Avoid bathing and swimming until the sutures dissolve and healing is complete.  Wear loose cotton underwear. Do not wear tight pants.  Keep all follow-up appointments with your caregiver.  Contact your caregiver to obtain your test results. SEEK IMMEDIATE MEDICAL CARE IF:  Your pain increases and medicine does not help it.  Your vaginal area becomes swollen or red.  You have fluid or an abnormally bad smell coming from your vagina.  You have a fever. MAKE SURE YOU:  Understand these instructions.  Will watch your condition.  Will get help right away if you are not doing well or get worse. Document Released: 04/20/2012 Document Reviewed: 02/29/2012 [facility] Patient Information 2014 [place], [place].

## 2012-11-22 LAB — GC/CHLAMYDIA PROBE AMP: GC Probe RNA: NEGATIVE

## 2012-11-28 ENCOUNTER — Ambulatory Visit (INDEPENDENT_AMBULATORY_CARE_PROVIDER_SITE_OTHER): Payer: Medicaid Other | Admitting: Obstetrics & Gynecology

## 2012-11-28 ENCOUNTER — Encounter: Payer: Self-pay | Admitting: Obstetrics & Gynecology

## 2012-11-28 VITALS — BP 98/66 | HR 83 | Temp 98.3°F | Ht 67.0 in | Wt 274.0 lb

## 2012-11-28 DIAGNOSIS — A630 Anogenital (venereal) warts: Secondary | ICD-10-CM

## 2012-11-28 DIAGNOSIS — Z3202 Encounter for pregnancy test, result negative: Secondary | ICD-10-CM

## 2012-11-28 DIAGNOSIS — Z30430 Encounter for insertion of intrauterine contraceptive device: Secondary | ICD-10-CM

## 2012-11-28 DIAGNOSIS — IMO0001 Reserved for inherently not codable concepts without codable children: Secondary | ICD-10-CM

## 2012-11-28 LAB — POCT URINE PREGNANCY: Preg Test, Ur: NEGATIVE

## 2012-11-28 MED ORDER — LEVONORGESTREL 20 MCG/24HR IU IUD
INTRAUTERINE_SYSTEM | Freq: Once | INTRAUTERINE | Status: AC
  Administered 2012-11-28: 10:00:00 via INTRAUTERINE

## 2012-11-28 NOTE — Patient Instructions (Signed)
Genital Warts Genital warts are a sexually transmitted infection. They may appear as small bumps on the tissues of the genital area. CAUSES  Genital warts are caused by a virus called human papillomavirus (HPV). HPV is the most common sexually transmitted disease (STD) and infection of the sex organs. This infection is spread by having unprotected sex with an infected person. It can be spread by vaginal, anal, and oral sex. Many people do not know they are infected. They may be infected for years without problems. However, even if they do not have problems, they can unknowingly pass the infection to their sexual partners. SYMPTOMS   Itching and irritation in the genital area.  Warts that bleed.  Painful sexual intercourse. DIAGNOSIS  Warts are usually recognized with the naked eye on the vagina, vulva, perineum, anus, and rectum. Certain tests can also diagnose genital warts, such as:  A Pap test.  A tissue sample (biopsy) exam.  Colposcopy. A magnifying tool is used to examine the vagina and cervix. The HPV cells will change color when certain solutions are used. TREATMENT  Warts can be removed by:  Applying certain chemicals, such as cantharidin or podophyllin.  Liquid nitrogen freezing (cryotherapy).  Immunotherapy with candida or trichophyton injections.  Laser treatment.  Burning with an electrified probe (electrocautery).  Interferon injections.  Surgery. PREVENTION  HPV vaccination can help prevent HPV infections that cause genital warts and that cause cancer of the cervix. It is recommended that the vaccination be given to people between the ages 43 to 88 years old. The vaccine might not work as well or might not work at all if you already have HPV. It should not be given to pregnant women. HOME CARE INSTRUCTIONS   It is important to follow your caregiver's instructions. The warts will not go away without treatment. Repeat treatments are often needed to get rid of warts.  Even after it appears that the warts are gone, the normal tissue underneath often remains infected.  Do not try to treat genital warts with medicine used to treat hand warts. This type of medicine is strong and can burn the skin in the genital area, causing more damage.  Tell your past and current sexual partner(s) that you have genital warts. They may be infected also and need treatment.  Avoid sexual contact while being treated.  Do not touch or scratch the warts. The infection may spread to other parts of your body.  Women with genital warts should have a cervical cancer check (Pap test) at least once a year. This type of cancer is slow-growing and can be cured if found early. Chances of developing cervical cancer are increased with HPV.  Inform your obstetrician about your warts in the event of pregnancy. This virus can be passed to the baby's respiratory tract. Discuss this with your caregiver.  Use a condom during sexual intercourse. Following treatment, the use of condoms will help prevent reinfection.  Ask your caregiver about using over-the-counter anti-itch creams. SEEK MEDICAL CARE IF:   Your treated skin becomes red, swollen, or painful.  You have a fever.  You feel generally ill.  You feel little lumps in and around your genital area.  You are bleeding or have painful sexual intercourse. MAKE SURE YOU:   Understand these instructions.  Will watch your condition.  Will get help right away if you are not doing well or get worse. Document Released: 05/01/2000 Document Revised: 07/27/2011 Document Reviewed: 11/10/2010 [facility] Patient Information 2014 [place], [place]. Levonorgestrel  intrauterine device (IUD) What is this medicine? LEVONORGESTREL IUD (LEE voe nor jes trel) is a contraceptive (birth control) device. The device is placed inside the uterus by a healthcare professional. It is used to prevent pregnancy and can also be used to treat heavy bleeding that occurs  during your period. Depending on the device, it can be used for 3 to 5 years. This medicine may be used for other purposes; ask your health care provider or pharmacist if you have questions. What should I tell my health care provider before I take this medicine? They need to know if you have any of these conditions: -abnormal Pap smear -cancer of the breast, uterus, or cervix -diabetes -endometritis -genital or pelvic infection now or in the past -have more than one sexual partner or your partner has more than one partner -heart disease -history of an ectopic or tubal pregnancy -immune system problems -IUD in place -liver disease or tumor -problems with blood clots or take blood-thinners -use intravenous drugs -uterus of unusual shape -vaginal bleeding that has not been explained -an unusual or allergic reaction to levonorgestrel, other hormones, silicone, or polyethylene, medicines, foods, dyes, or preservatives -pregnant or trying to get pregnant -breast-feeding How should I use this medicine? This device is placed inside the uterus by a health care professional. Talk to your pediatrician regarding the use of this medicine in children. Special care may be needed. Overdosage: If you think you have taken too much of this medicine contact a poison control center or emergency room at once. NOTE: This medicine is only for you. Do not share this medicine with others. What if I miss a dose? This does not apply. What may interact with this medicine? Do not take this medicine with any of the following medications: -amprenavir -bosentan -fosamprenavir This medicine may also interact with the following medications: -aprepitant -barbiturate medicines for inducing sleep or treating seizures -bexarotene -griseofulvin -medicines to treat seizures like carbamazepine, ethotoin, felbamate, oxcarbazepine, phenytoin,  topiramate -modafinil -pioglitazone -rifabutin -rifampin -rifapentine -some medicines to treat HIV infection like atazanavir, indinavir, lopinavir, nelfinavir, tipranavir, ritonavir -St. John's wort -warfarin This list may not describe all possible interactions. Give your health care provider a list of all the medicines, herbs, non-prescription drugs, or dietary supplements you use. Also tell them if you smoke, drink alcohol, or use illegal drugs. Some items may interact with your medicine. What should I watch for while using this medicine? Visit your doctor or health care professional for regular check ups. See your doctor if you or your partner has sexual contact with others, becomes HIV positive, or gets a sexual transmitted disease. This product does not protect you against HIV infection (AIDS) or other sexually transmitted diseases. You can check the placement of the IUD yourself by reaching up to the top of your vagina with clean fingers to feel the threads. Do not pull on the threads. It is a good habit to check placement after each menstrual period. Call your doctor right away if you feel more of the IUD than just the threads or if you cannot feel the threads at all. The IUD may come out by itself. You may become pregnant if the device comes out. If you notice that the IUD has come out use a backup birth control method like condoms and call your health care provider. Using tampons will not change the position of the IUD and are okay to use during your period. What side effects may I notice from receiving this medicine?  Side effects that you should report to your doctor or health care professional as soon as possible: -allergic reactions like skin rash, itching or hives, swelling of the face, lips, or tongue -fever, flu-like symptoms -genital sores -high blood pressure -no menstrual period for 6 weeks during use -pain, swelling, warmth in the leg -pelvic pain or tenderness -severe or  sudden headache -signs of pregnancy -stomach cramping -sudden shortness of breath -trouble with balance, talking, or walking -unusual vaginal bleeding, discharge -yellowing of the eyes or skin Side effects that usually do not require medical attention (report to your doctor or health care professional if they continue or are bothersome): -acne -breast pain -change in sex drive or performance -changes in weight -cramping, dizziness, or faintness while the device is being inserted -headache -irregular menstrual bleeding within first 3 to 6 months of use -nausea This list may not describe all possible side effects. Call your doctor for medical advice about side effects. You may report side effects to FDA at 1-800-FDA-1088. Where should I keep my medicine? This does not apply. NOTE: This sheet is a summary. It may not cover all possible information. If you have questions about this medicine, talk to your doctor, pharmacist, or health care provider.  2013, [employer]. (06/04/2011 1:54:04 PM)

## 2012-11-28 NOTE — Progress Notes (Signed)
.  IUD Insertion Procedure Note    Indications: contraception  Procedure Details  Urine pregnancy test was done and result was negative.  The risks (including infection, bleeding, pain, and uterine perforation) and benefits of the procedure were explained to the patient and Written informed consent was obtained.    Cervix cleansed with Betadine. Uterus sounded to 9 cm. IUD inserted without difficulty. String visible and trimmed. Patient tolerated procedure well.  IUD Information: Mirena.  Condition: Stable  Complications: None  Plan:  The patient was advised to call for any fever or for prolonged or severe pain or bleeding. She was advised to use OTC analgesics as needed for mild to moderate pain.

## 2012-12-26 ENCOUNTER — Other Ambulatory Visit: Payer: Self-pay | Admitting: *Deleted

## 2012-12-27 ENCOUNTER — Encounter: Payer: Self-pay | Admitting: Obstetrics & Gynecology

## 2013-01-09 ENCOUNTER — Ambulatory Visit (INDEPENDENT_AMBULATORY_CARE_PROVIDER_SITE_OTHER): Payer: Medicaid Other | Admitting: Obstetrics & Gynecology

## 2013-01-09 ENCOUNTER — Ambulatory Visit: Payer: Self-pay | Admitting: Obstetrics & Gynecology

## 2013-01-09 ENCOUNTER — Encounter: Payer: Self-pay | Admitting: Obstetrics & Gynecology

## 2013-01-09 VITALS — BP 111/73 | HR 92 | Temp 99.2°F | Wt 277.0 lb

## 2013-01-09 DIAGNOSIS — Z309 Encounter for contraceptive management, unspecified: Secondary | ICD-10-CM

## 2013-01-09 DIAGNOSIS — Z3202 Encounter for pregnancy test, result negative: Secondary | ICD-10-CM

## 2013-01-09 MED ORDER — NORETHIN-ETH ESTRAD-FE BIPHAS 1 MG-10 MCG / 10 MCG PO TABS: 1.0000 | ORAL_TABLET | Freq: Every day | ORAL | Status: DC

## 2013-01-09 NOTE — Progress Notes (Signed)
Subjective:     Ariana Rice is a 27 y.o. female here for a routine exam.  Current complaints: follow up to IUD. Pt states she started taking birth control pills about 3 weeks ago to help with heavy bleeding. Pt states the bleeding has stopped.  Personal health questionnaire reviewed: yes.   Gynecologic History No LMP recorded. Patient is not currently having periods (Reason: IUD). Contraception: IUD   Obstetric History OB History  Gravida Para Term Preterm AB SAB TAB Ectopic Multiple Living  1 1 1       1     # Outcome Date GA Lbr Len/2nd Weight Sex Delivery Anes PTL Lv  1 TRM 10/02/12 [redacted]w[redacted]d 09:27 / 00:22 5 lb 15.9 oz (2.72 kg) M SVD EPI  Y       The following portions of the patient's history were reviewed and updated as appropriate: allergies, current medications, past family history, past medical history, past social history, past surgical history and problem list.  Review of Systems Pertinent items are noted in HPI.    Objective:     SPEC:  IUD strings not seen Informal U/S--3D: IUD not seen     Assessment:   ?IUD expelled  Plan:   Formal U/S AXR Continue COCP Return in 2 weeks

## 2013-01-23 ENCOUNTER — Ambulatory Visit (HOSPITAL_COMMUNITY)
Admission: RE | Admit: 2013-01-23 | Discharge: 2013-01-23 | Disposition: A | Discharge status: Home / Self Care | Payer: Medicaid Other | Source: Ambulatory Visit | Attending: Obstetrics & Gynecology | Admitting: Obstetrics & Gynecology

## 2013-01-23 DIAGNOSIS — Z309 Encounter for contraceptive management, unspecified: Secondary | ICD-10-CM

## 2013-01-23 DIAGNOSIS — N83209 Unspecified ovarian cyst, unspecified side: Secondary | ICD-10-CM | POA: Insufficient documentation

## 2013-01-23 DIAGNOSIS — Z30431 Encounter for routine checking of intrauterine contraceptive device: Secondary | ICD-10-CM | POA: Insufficient documentation

## 2013-01-29 ENCOUNTER — Encounter: Payer: Self-pay | Admitting: Obstetrics & Gynecology

## 2013-01-29 DIAGNOSIS — N83209 Unspecified ovarian cyst, unspecified side: Secondary | ICD-10-CM | POA: Insufficient documentation

## 2013-03-01 ENCOUNTER — Ambulatory Visit (INDEPENDENT_AMBULATORY_CARE_PROVIDER_SITE_OTHER): Payer: Medicaid Other | Admitting: Obstetrics & Gynecology

## 2013-03-01 ENCOUNTER — Encounter: Payer: Self-pay | Admitting: Obstetrics & Gynecology

## 2013-03-01 VITALS — BP 121/81 | HR 72 | Temp 97.0°F | Ht 67.0 in | Wt 285.0 lb

## 2013-03-01 DIAGNOSIS — IMO0001 Reserved for inherently not codable concepts without codable children: Secondary | ICD-10-CM

## 2013-03-01 DIAGNOSIS — Z30433 Encounter for removal and reinsertion of intrauterine contraceptive device: Secondary | ICD-10-CM

## 2013-03-01 DIAGNOSIS — Z30430 Encounter for insertion of intrauterine contraceptive device: Secondary | ICD-10-CM

## 2013-03-01 DIAGNOSIS — Z30432 Encounter for removal of intrauterine contraceptive device: Secondary | ICD-10-CM

## 2013-03-01 MED ORDER — LEVONORGESTREL 20 MCG/24HR IU IUD
INTRAUTERINE_SYSTEM | Freq: Once | INTRAUTERINE | Status: AC
  Administered 2013-03-01: 14:00:00 via INTRAUTERINE

## 2013-03-01 NOTE — Progress Notes (Signed)
IUD Removal Procedure Note/Insertion Note  Pre-operative Diagnosis: Malpositioned IUD  Post-operative Diagnosis: same  Indications: contraception  Procedure Details   The risks (including infection, bleeding, pain, and uterine perforation) and benefits of the procedure were explained to the patient and Written informed consent was obtained.  The IUD strings were grasped with an IUD hook.  The strings were then grasped and the IUD removed intact.    Cervix cleansed with Betadine. Uterus sounded to 8 cm. IUD inserted without difficulty. String visible and trimmed. Patient tolerated procedure well.  An informal 2-D U/S confirmed appropriate positioning.  IUD Information: Mirena.  Condition: Stable  Complications: None  Plan:  Return in 6 weeks  The patient was advised to call for any fever or for prolonged or severe pain or bleeding. She was advised to use OTC analgesics as needed for mild to moderate pain.

## 2013-03-02 ENCOUNTER — Encounter: Payer: Self-pay | Admitting: Obstetrics & Gynecology

## 2013-03-23 ENCOUNTER — Other Ambulatory Visit: Payer: Self-pay

## 2013-04-10 ENCOUNTER — Encounter: Payer: Self-pay | Admitting: Obstetrics & Gynecology

## 2013-04-10 ENCOUNTER — Ambulatory Visit (INDEPENDENT_AMBULATORY_CARE_PROVIDER_SITE_OTHER): Payer: Medicaid Other | Admitting: Obstetrics & Gynecology

## 2013-04-10 VITALS — BP 119/75 | HR 88 | Temp 100.3°F | Ht 67.0 in | Wt 285.0 lb

## 2013-04-10 DIAGNOSIS — Z30431 Encounter for routine checking of intrauterine contraceptive device: Secondary | ICD-10-CM

## 2013-04-10 NOTE — Progress Notes (Signed)
Subjective:     Ariana Rice is a 27 y.o. female here for a routine exam.  Current complaints: follow up for an IUD insertion.  Personal health questionnaire reviewed: yes.   Gynecologic History No LMP recorded. Patient is not currently having periods (Reason: IUD). Contraception: IUD Last Pap: 2013. Results were: normal   Obstetric History OB History  Gravida Para Term Preterm AB SAB TAB Ectopic Multiple Living  1 1 1       1     # Outcome Date GA Lbr Len/2nd Weight Sex Delivery Anes PTL Lv  1 TRM 10/02/12 [redacted]w[redacted]d 09:27 / 00:22 5 lb 15.9 oz (2.72 kg) M SVD EPI  Y       The following portions of the patient's history were reviewed and updated as appropriate: allergies, current medications, past family history, past medical history, past social history, past surgical history and problem list.  Review of Systems Pertinent items are noted in HPI.    Objective:     SPEC: IUD strings present Bimanual: uterus NT, adnexa NT     Assessment:    IUD in place  Plan:    Return prn or 1 yr

## 2014-01-08 ENCOUNTER — Other Ambulatory Visit (HOSPITAL_COMMUNITY): Payer: Self-pay | Admitting: Obstetrics

## 2014-01-08 ENCOUNTER — Inpatient Hospital Stay (HOSPITAL_COMMUNITY): Payer: Medicaid Other

## 2014-01-08 ENCOUNTER — Encounter (HOSPITAL_COMMUNITY): Payer: Self-pay | Admitting: *Deleted

## 2014-01-08 ENCOUNTER — Ambulatory Visit (HOSPITAL_COMMUNITY)
Admission: AD | Admit: 2014-01-08 | Discharge: 2014-01-10 | Disposition: A | Discharge status: Home / Self Care | Payer: Medicaid Other | Source: Ambulatory Visit | Attending: General Surgery | Admitting: General Surgery

## 2014-01-08 DIAGNOSIS — R1011 Right upper quadrant pain: Secondary | ICD-10-CM

## 2014-01-08 DIAGNOSIS — Z87891 Personal history of nicotine dependence: Secondary | ICD-10-CM | POA: Diagnosis not present

## 2014-01-08 DIAGNOSIS — K810 Acute cholecystitis: Secondary | ICD-10-CM | POA: Diagnosis present

## 2014-01-08 DIAGNOSIS — K8000 Calculus of gallbladder with acute cholecystitis without obstruction: Secondary | ICD-10-CM | POA: Diagnosis present

## 2014-01-08 DIAGNOSIS — K8001 Calculus of gallbladder with acute cholecystitis with obstruction: Secondary | ICD-10-CM | POA: Diagnosis present

## 2014-01-08 LAB — CBC WITH DIFFERENTIAL/PLATELET
BASOS ABS: 0 10*3/uL (ref 0.0–0.1)
BASOS PCT: 0 % (ref 0–1)
EOS ABS: 0.1 10*3/uL (ref 0.0–0.7)
EOS PCT: 1 % (ref 0–5)
HCT: 35.7 % — ABNORMAL LOW (ref 36.0–46.0)
Hemoglobin: 11.4 g/dL — ABNORMAL LOW (ref 12.0–15.0)
LYMPHS PCT: 36 % (ref 12–46)
Lymphs Abs: 1.8 10*3/uL (ref 0.7–4.0)
MCH: 27.1 pg (ref 26.0–34.0)
MCHC: 31.9 g/dL (ref 30.0–36.0)
MCV: 85 fL (ref 78.0–100.0)
MONO ABS: 0.5 10*3/uL (ref 0.1–1.0)
Monocytes Relative: 9 % (ref 3–12)
Neutro Abs: 2.7 10*3/uL (ref 1.7–7.7)
Neutrophils Relative %: 54 % (ref 43–77)
PLATELETS: 262 10*3/uL (ref 150–400)
RBC: 4.2 MIL/uL (ref 3.87–5.11)
RDW: 14.1 % (ref 11.5–15.5)
WBC: 5 10*3/uL (ref 4.0–10.5)

## 2014-01-08 LAB — COMPREHENSIVE METABOLIC PANEL
ALK PHOS: 61 U/L (ref 39–117)
ALT: 22 U/L (ref 0–35)
AST: 22 U/L (ref 0–37)
Albumin: 3.8 g/dL (ref 3.5–5.2)
Anion gap: 10 (ref 5–15)
BILIRUBIN TOTAL: 0.3 mg/dL (ref 0.3–1.2)
BUN: 11 mg/dL (ref 6–23)
CHLORIDE: 103 meq/L (ref 96–112)
CO2: 28 meq/L (ref 19–32)
CREATININE: 0.84 mg/dL (ref 0.50–1.10)
Calcium: 9 mg/dL (ref 8.4–10.5)
GFR calc Af Amer: >90 mL/min (ref >90)
Glucose, Bld: 95 mg/dL (ref 70–99)
POTASSIUM: 4 meq/L (ref 3.7–5.3)
Sodium: 141 mEq/L (ref 137–147)
Total Protein: 7.3 g/dL (ref 6.0–8.3)

## 2014-01-08 LAB — URINALYSIS, ROUTINE W REFLEX MICROSCOPIC
Bilirubin Urine: NEGATIVE
GLUCOSE, UA: NEGATIVE mg/dL
KETONES UR: 15 mg/dL — AB
Nitrite: NEGATIVE
PROTEIN: NEGATIVE mg/dL
Specific Gravity, Urine: 1.025 (ref 1.005–1.030)
Urobilinogen, UA: 1 mg/dL (ref 0.0–1.0)
pH: 6 (ref 5.0–8.0)

## 2014-01-08 LAB — URINE MICROSCOPIC-ADD ON

## 2014-01-08 LAB — POCT PREGNANCY, URINE: Preg Test, Ur: NEGATIVE

## 2014-01-08 MED ORDER — ONDANSETRON HCL 4 MG/2ML IJ SOLN
4.0000 mg | Freq: Once | INTRAMUSCULAR | Status: AC
  Administered 2014-01-08: 4 mg via INTRAVENOUS
  Filled 2014-01-08: qty 2

## 2014-01-08 MED ORDER — GI COCKTAIL ~~LOC~~
30.0000 mL | Freq: Once | ORAL | Status: AC
Start: 2014-01-08 — End: 2014-01-08
  Administered 2014-01-08: 30 mL via ORAL
  Filled 2014-01-08: qty 30

## 2014-01-08 MED ORDER — HYDROMORPHONE HCL PF 1 MG/ML IJ SOLN
1.0000 mg | Freq: Once | INTRAMUSCULAR | Status: AC
  Administered 2014-01-08: 1 mg via INTRAVENOUS
  Filled 2014-01-08: qty 1

## 2014-01-08 NOTE — MAU Note (Signed)
C/o of sharp upper abdominal pain. Maybe the 3-4 th time in the last few months. Unable to lay or sit for long periods of time due to discomfort.

## 2014-01-08 NOTE — MAU Provider Note (Signed)
CC: Abdominal Pain    First Provider Initiated Contact with Patient 01/08/14 1926      HPI Ariana Rice is a 28 y.o. G1P1001 who presents with onset this am of epigastric and RUQ abdominal pain starting 1 hr after eating. Describes the pain as increasingly severe and steady but waxes and wanes. She vomited once today. Treated with ibuprofen 600 mg once today. A chicken nidus about 3 PM and felt nausea but has not vomited again. Pain does not radiate to back or shoulder. Denies constipation or diarrhea. No lower abdominal pain.Last night she ate pork which she had not eaten for about a year. She's had similar episodes 3 or 4 times over the past year. Denies dysuria, hematuria, frequency or urgency of urination. IUD in place.     Past Medical History  Diagnosis Date  . Anginal pain     intermittent- had cardiac workup- unknown cause    OB History  Gravida Para Term Preterm AB SAB TAB Ectopic Multiple Living  # Outcome Date GA Lbr Len/2nd Weight Sex Delivery Anes PTL Lv  1 TRM 10/02/12 [redacted]w[redacted]d 09:27 / 00:22 2.72 kg (5 lb 15.9 oz) M SVD EPI  Y      Past Surgical History  Procedure Laterality Date  . No past surgeries      History   Social History  . Marital Status: Single    Spouse Name: N/A    Number of Children: N/A  . Years of Education: N/A   Occupational History  . Not on file.   Social History Main Topics  . Smoking status: Former Smoker    Quit date: 01/10/2008  . Smokeless tobacco: Never Used  . Alcohol Use: No  . Drug Use: No  . Sexual Activity: Yes    Birth Control/ Protection: IUD   Other Topics Concern  . Not on file   Social History Narrative  . No narrative on file    No current facility-administered medications on file prior to encounter.   Current Outpatient Prescriptions on File Prior to Encounter  Medication Sig Dispense Refill  . Cholecalciferol (VITAMIN D3) 1000 UNITS CHEW Chew 1 tablet by mouth daily.      Marland Kitchen ibuprofen  (ADVIL,MOTRIN) 600 MG tablet Take 1 tablet (600 mg total) by mouth every 6 (six) hours as needed for pain.  30 tablet  5  . norethindrone (MICRONOR,CAMILA,ERRIN) 0.35 MG tablet Take 1 tablet by mouth daily.      . Prenatal Vit-Fe Fumarate-FA (PRENATAL MULTIVITAMIN) TABS Take 1 tablet by mouth at bedtime.         No Known Allergies  ROS Pertinent items in HPI  PHYSICAL EXAM Filed Vitals:   01/08/14 1917  BP: 123/72  Pulse: 80  Temp: 99.6 F (37.6 C)  Resp: 20   General: Well nourished, well developed female in moderate distress, rocking in bed Cardiovascular: Normal rate Respiratory: Normal effort Abdomen: Soft, BS present;  Positive Murphy's sign, moderate right coastal margin and epigastric TTP with guarding, no rebound Back: No CVAT Extremities: No edema Neurologic: Alert and oriented  LAB RESULTS Results for orders placed during the hospital encounter of 01/08/14 (from the past 24 hour(s))  URINALYSIS, ROUTINE W REFLEX MICROSCOPIC     Status: Abnormal   Collection Time    01/08/14  7:00 PM      Result Value Ref Range   Color, Urine YELLOW  YELLOW  APPearance CLEAR  CLEAR   Specific Gravity, Urine 1.025  1.005 - 1.030   pH 6.0  5.0 - 8.0   Glucose, UA NEGATIVE  NEGATIVE mg/dL   Hgb urine dipstick SMALL (*) NEGATIVE   Bilirubin Urine NEGATIVE  NEGATIVE   Ketones, ur 15 (*) NEGATIVE mg/dL   Protein, ur NEGATIVE  NEGATIVE mg/dL   Urobilinogen, UA 1.0  0.0 - 1.0 mg/dL   Nitrite NEGATIVE  NEGATIVE   Leukocytes, UA SMALL (*) NEGATIVE  URINE MICROSCOPIC-ADD ON     Status: Abnormal   Collection Time    01/08/14  7:00 PM      Result Value Ref Range   Squamous Epithelial / LPF MANY (*) RARE   WBC, UA 7-10  <3 WBC/hpf   RBC / HPF 3-6  <3 RBC/hpf   Bacteria, UA MANY (*) RARE   Urine-Other MUCOUS PRESENT    POCT PREGNANCY, URINE     Status: None   Collection Time    01/08/14  7:17 PM      Result Value Ref Range   Preg Test, Ur NEGATIVE  NEGATIVE  COMPREHENSIVE  METABOLIC PANEL     Status: None   Collection Time    01/08/14  7:48 PM      Result Value Ref Range   Sodium 141  137 - 147 mEq/L   Potassium 4.0  3.7 - 5.3 mEq/L   Chloride 103  96 - 112 mEq/L   CO2 28  19 - 32 mEq/L   Glucose, Bld 95  70 - 99 mg/dL   BUN 11  6 - 23 mg/dL   Creatinine, Ser 7.82  0.50 - 1.10 mg/dL   Calcium 9.0  8.4 - 95.6 mg/dL   Total Protein 7.3  6.0 - 8.3 g/dL   Albumin 3.8  3.5 - 5.2 g/dL   AST 22  0 - 37 U/L   ALT 22  0 - 35 U/L   Alkaline Phosphatase 61  39 - 117 U/L   Total Bilirubin 0.3  0.3 - 1.2 mg/dL   GFR calc non Af Amer >90  >90 mL/min   GFR calc Af Amer >90  >90 mL/min   Anion gap 10  5 - 15  CBC WITH DIFFERENTIAL     Status: Abnormal   Collection Time    01/08/14  7:48 PM      Result Value Ref Range   WBC 5.0  4.0 - 10.5 K/uL   RBC 4.20  3.87 - 5.11 MIL/uL   Hemoglobin 11.4 (*) 12.0 - 15.0 g/dL   HCT 21.3 (*) 08.6 - 57.8 %   MCV 85.0  78.0 - 100.0 fL   MCH 27.1  26.0 - 34.0 pg   MCHC 31.9  30.0 - 36.0 g/dL   RDW 46.9  62.9 - 52.8 %   Platelets 262  150 - 400 K/uL   Neutrophils Relative % 54  43 - 77 %   Neutro Abs 2.7  1.7 - 7.7 K/uL   Lymphocytes Relative 36  12 - 46 %   Lymphs Abs 1.8  0.7 - 4.0 K/uL   Monocytes Relative 9  3 - 12 %   Monocytes Absolute 0.5  0.1 - 1.0 K/uL   Eosinophils Relative 1  0 - 5 %   Eosinophils Absolute 0.1  0.0 - 0.7 K/uL   Basophils Relative 0  0 - 1 %   Basophils Absolute 0.0  0.0 - 0.1 K/uL    IMAGING  No results found.  MAU COURSE GI cocktail given> pain unimproved Discussed with Dr. Jolayne Panther transfer to [facility]   ASSESSMENT  1. Colicky RUQ abdominal pain     PLAN Transfer to [facility]. Dr. Littie Deeds accepts transfer    Danae Orleans, CNM 01/08/2014 7:27 PM

## 2014-01-08 NOTE — ED Notes (Signed)
[facility] Patient has had abdominal pain for two months. They sent her to the ED to rule out Gall bladder. Patient has an IUD in. Patient is not pregnant.

## 2014-01-08 NOTE — ED Notes (Signed)
Bedside report received from previous RN, Jeneen. 

## 2014-01-08 NOTE — ED Provider Notes (Signed)
CSN: [id number]     Arrival date & time 01/08/14  1843 History   First MD Initiated Contact with Patient 01/08/14 2218     Chief Complaint  Patient presents with  . Abdominal Pain   (Consider location/radiation/quality/duration/timing/severity/associated sxs/prior Treatment) HPI Comments: Patient with no history of abdominal surgery -- presents with complaint of right upper quadrant abdominal pain that began earlier this morning. Patient has had 3-4 similar episodes over the past several months but the pain has never been this severe or persistent. Patient had 2 episodes of vomiting this morning. She was able to eat chicken nuggets this afternoon without vomiting -- however the pain worsened after eating these. Pain does not radiate to her back or shoulders. Patient has not had diarrhea, dysuria, hematuria. No treatments prior to arrival other than ibuprofen this morning. She denies recent alcohol use or heavy NSAID use. Patient was seen at the [facility] hospital MAU prior to ED visit tonight. She was given GI cocktail without relief of symptoms. The onset of this condition was acute. The course is constant. Alleviating factors: none.    Patient is a 28 y.o. female presenting with abdominal pain. The history is provided by the patient.  Abdominal Pain Associated symptoms: nausea and vomiting   Associated symptoms: no chest pain, no cough, no diarrhea, no dysuria, no fever and no sore throat     Past Medical History  Diagnosis Date  . Anginal pain     intermittent- had cardiac workup- unknown cause   Past Surgical History  Procedure Laterality Date  . No past surgeries     Family History  Problem Relation Age of Onset  . Other Neg Hx   . Hypertension Mother   . Diabetes Father   . Diabetes Maternal Grandmother   . Kidney disease Maternal Grandmother    History  Substance Use Topics  . Smoking status: Former Smoker    Quit date: 01/10/2008  . Smokeless tobacco: Never Used  .  Alcohol Use: No   OB History   Grav Para Term Preterm Abortions TAB SAB Ect Mult Living   Review of Systems  Constitutional: Negative for fever.  HENT: Negative for rhinorrhea and sore throat.   Eyes: Negative for redness.  Respiratory: Negative for cough.   Cardiovascular: Negative for chest pain.  Gastrointestinal: Positive for nausea, vomiting and abdominal pain. Negative for diarrhea.  Genitourinary: Negative for dysuria.  Musculoskeletal: Negative for myalgias.  Skin: Negative for rash.  Neurological: Negative for headaches.   Allergies  Review of patient's allergies indicates no known allergies.  Home Medications   Prior to Admission medications   Medication Sig Start Date End Date Taking? Authorizing Provider  ibuprofen (ADVIL,MOTRIN) 200 MG tablet Take 600 mg by mouth every 6 (six) hours as needed for moderate pain.   Yes Historical Provider, MD  levonorgestrel (MIRENA) 20 MCG/24HR IUD 1 each by Intrauterine route once. Placed September 2014   Yes Historical Provider, MD   BP 128/83  Pulse 77  Temp(Src) 98 F (36.7 C) (Oral)  Resp 18  Ht  (1.702 m)  Wt 288 lb (130.636 kg)  BMI 45.10 kg/m2  SpO2 100%  LMP 09/08/2013  Breastfeeding? No  Physical Exam  Nursing note and vitals reviewed. Constitutional: She appears well-developed and well-nourished.  HENT:  Head: Normocephalic and atraumatic.  Eyes: Conjunctivae are normal. Right eye exhibits no discharge. Left eye exhibits no discharge.  Neck: Normal range of motion. Neck supple.  Cardiovascular: Normal rate, regular rhythm and normal heart sounds.   Pulmonary/Chest: Effort normal and breath sounds normal.  Abdominal: Soft. Bowel sounds are normal. There is tenderness in the right upper quadrant and epigastric area. There is no rigidity, no rebound, no guarding, no CVA tenderness, no tenderness at McBurney's point and negative Murphy's sign.  Neurological: She is alert.  Skin: Skin is  warm and dry.  Psychiatric: She has a normal mood and affect.    ED Course  Procedures (including critical care time) Labs Review Labs Reviewed  URINALYSIS, ROUTINE W REFLEX MICROSCOPIC - Abnormal; Notable for the following:    Hgb urine dipstick SMALL (*)    Ketones, ur 15 (*)    Leukocytes, UA SMALL (*)    All other components within normal limits  CBC WITH DIFFERENTIAL - Abnormal; Notable for the following:    Hemoglobin 11.4 (*)    HCT 35.7 (*)    All other components within normal limits  URINE MICROSCOPIC-ADD ON - Abnormal; Notable for the following:    Squamous Epithelial / LPF MANY (*)    Bacteria, UA MANY (*)    All other components within normal limits  COMPREHENSIVE METABOLIC PANEL  LIPASE, BLOOD  POCT PREGNANCY, URINE    Imaging Review US Abdomen Complete  01/09/2014   CLINICAL DATA:  Right upper quadrant pain.  EXAM: ULTRASOUND ABDOMEN COMPLETE  COMPARISON:  None.  FINDINGS: Gallbladder:  Mildly distended gallbladder. Small stones layering in the dependent portion. Gallbladder wall thickening and edema with wall thickness measured at 7.7 mm. Murphy's sign is positive.  Common bile duct:  Diameter: 5.3 mm, normal  Liver:  Mildly increased hepatic parenchymal echotexture homogeneous lead suggesting diffuse fatty infiltration.  IVC:  No abnormality visualized.  Pancreas:  Visualized portion unremarkable.  Spleen:  Size and appearance within normal limits.  Right Kidney:  Length: 10.7 cm. Echogenicity within normal limits. No mass or hydronephrosis visualized.  Left Kidney:  Length: 10.6 cm. Echogenicity within normal limits. No mass or hydronephrosis visualized.  Abdominal aorta:  No aneurysm visualized.  Other findings:  None.  IMPRESSION: Cholelithiasis with gallbladder wall thickening and positive Murphy's sign. Appearance is consistent with acute cholecystitis in the appropriate clinical setting. Probable fatty infiltration of the liver.   Electronically Signed   By: Burman Nieves M.D.   On: 01/09/2014 00:27     EKG Interpretation   Date/Time:  Monday January 08 2014 22:23:19 EDT Ventricular Rate:  77 PR Interval:  184 QRS Duration: 109 QT Interval:  428 QTC Calculation: 484 R Axis:   -18 Text Interpretation:  Sinus rhythm Borderline left axis deviation  Borderline prolonged QT interval Baseline wander No old tracing to compare  Confirmed by [facility]  MD, Nicholos Johns [phone]) on 01/08/2014 10:31:25 PM      10:29 PM Patient seen and examined. Work-up initiated. Medications ordered.   Vital signs reviewed and are as follows: BP 128/83  Pulse 77  Temp(Src) 98 F (36.7 C) (Oral)  Resp 18  Ht  (1.702 m)  Wt 288 lb (130.636 kg)  BMI 45.10 kg/m2  SpO2 100%  LMP 09/08/2013  Breastfeeding? No  12:53 AM [place] is positive for acute cholecystitis. Patient discussed with Dr. Clarene Duke.   I have spoken to Dr. Johna Sheriff who will evaluate patient.    Patient requiring additional doses of pain medication.    MDM   Final diagnoses:  Acute cholecystitis   Pending surgery eval.  Renne Crigler, PA-C 01/09/14 [id number]

## 2014-01-08 NOTE — ED Notes (Signed)
US at bedside

## 2014-01-09 ENCOUNTER — Encounter (HOSPITAL_COMMUNITY): Payer: Medicaid Other | Admitting: *Deleted

## 2014-01-09 ENCOUNTER — Encounter (HOSPITAL_COMMUNITY): Payer: Self-pay | Admitting: *Deleted

## 2014-01-09 ENCOUNTER — Observation Stay (HOSPITAL_COMMUNITY): Payer: Medicaid Other | Admitting: *Deleted

## 2014-01-09 ENCOUNTER — Observation Stay (HOSPITAL_COMMUNITY): Payer: Medicaid Other

## 2014-01-09 ENCOUNTER — Encounter (HOSPITAL_COMMUNITY)
Admission: AD | Disposition: A | Discharge status: Home / Self Care | Payer: Self-pay | Source: Ambulatory Visit | Attending: Emergency Medicine

## 2014-01-09 DIAGNOSIS — K8001 Calculus of gallbladder with acute cholecystitis with obstruction: Secondary | ICD-10-CM | POA: Diagnosis present

## 2014-01-09 DIAGNOSIS — K8010 Calculus of gallbladder with chronic cholecystitis without obstruction: Secondary | ICD-10-CM

## 2014-01-09 DIAGNOSIS — Z87891 Personal history of nicotine dependence: Secondary | ICD-10-CM | POA: Diagnosis not present

## 2014-01-09 DIAGNOSIS — K8000 Calculus of gallbladder with acute cholecystitis without obstruction: Secondary | ICD-10-CM | POA: Diagnosis not present

## 2014-01-09 DIAGNOSIS — K8020 Calculus of gallbladder without cholecystitis without obstruction: Secondary | ICD-10-CM

## 2014-01-09 HISTORY — PX: CHOLECYSTECTOMY: SHX55

## 2014-01-09 LAB — CBC
HEMATOCRIT: 34.8 % — AB (ref 36.0–46.0)
HEMOGLOBIN: 10.7 g/dL — AB (ref 12.0–15.0)
MCH: 26.4 pg (ref 26.0–34.0)
MCHC: 30.7 g/dL (ref 30.0–36.0)
MCV: 85.9 fL (ref 78.0–100.0)
Platelets: 249 10*3/uL (ref 150–400)
RBC: 4.05 MIL/uL (ref 3.87–5.11)
RDW: 14.2 % (ref 11.5–15.5)
WBC: 3.9 10*3/uL — AB (ref 4.0–10.5)

## 2014-01-09 LAB — SURGICAL PCR SCREEN
MRSA, PCR: NEGATIVE
Staphylococcus aureus: NEGATIVE

## 2014-01-09 LAB — CREATININE, SERUM
Creatinine, Ser: 0.78 mg/dL (ref 0.50–1.10)
GFR calc Af Amer: >90 mL/min (ref >90)

## 2014-01-09 LAB — LIPASE, BLOOD: Lipase: 16 U/L (ref 11–59)

## 2014-01-09 SURGERY — LAPAROSCOPIC CHOLECYSTECTOMY WITH INTRAOPERATIVE CHOLANGIOGRAM
Anesthesia: General

## 2014-01-09 MED ORDER — GLYCOPYRROLATE 0.2 MG/ML IJ SOLN
INTRAMUSCULAR | Status: AC
  Filled 2014-01-09: qty 3

## 2014-01-09 MED ORDER — MIDAZOLAM HCL 2 MG/2ML IJ SOLN
INTRAMUSCULAR | Status: AC
  Filled 2014-01-09: qty 2

## 2014-01-09 MED ORDER — OXYCODONE HCL 5 MG/5ML PO SOLN
5.0000 mg | Freq: Once | ORAL | Status: DC | PRN
  Filled 2014-01-09: qty 5

## 2014-01-09 MED ORDER — NEOSTIGMINE METHYLSULFATE 10 MG/10ML IV SOLN
INTRAVENOUS | Status: DC | PRN
  Administered 2014-01-09: 4 mg via INTRAVENOUS

## 2014-01-09 MED ORDER — ONDANSETRON HCL 4 MG/2ML IJ SOLN
INTRAMUSCULAR | Status: DC | PRN
  Administered 2014-01-09: 4 mg via INTRAVENOUS

## 2014-01-09 MED ORDER — FENTANYL CITRATE 0.05 MG/ML IJ SOLN
INTRAMUSCULAR | Status: AC
  Filled 2014-01-09: qty 5

## 2014-01-09 MED ORDER — PANTOPRAZOLE SODIUM 40 MG IV SOLR
40.0000 mg | Freq: Every day | INTRAVENOUS | Status: DC
  Administered 2014-01-09: 40 mg via INTRAVENOUS
  Filled 2014-01-09 (×2): qty 40

## 2014-01-09 MED ORDER — ONDANSETRON HCL 4 MG/2ML IJ SOLN
INTRAMUSCULAR | Status: AC
  Filled 2014-01-09: qty 2

## 2014-01-09 MED ORDER — BUPIVACAINE-EPINEPHRINE (PF) 0.5% -1:200000 IJ SOLN
INTRAMUSCULAR | Status: AC
  Filled 2014-01-09: qty 30

## 2014-01-09 MED ORDER — HYDROMORPHONE HCL PF 1 MG/ML IJ SOLN
1.0000 mg | INTRAMUSCULAR | Status: DC | PRN
  Administered 2014-01-09 – 2014-01-10 (×4): 1 mg via INTRAVENOUS
  Filled 2014-01-09 (×5): qty 1

## 2014-01-09 MED ORDER — 0.9 % SODIUM CHLORIDE (POUR BTL) OPTIME
TOPICAL | Status: DC | PRN
  Administered 2014-01-09: 1000 mL

## 2014-01-09 MED ORDER — LACTATED RINGERS IR SOLN
Status: DC | PRN
  Administered 2014-01-09: 1

## 2014-01-09 MED ORDER — PROPOFOL 10 MG/ML IV BOLUS
INTRAVENOUS | Status: DC | PRN
  Administered 2014-01-09: 200 mg via INTRAVENOUS

## 2014-01-09 MED ORDER — CEFAZOLIN SODIUM-DEXTROSE 2-3 GM-% IV SOLR
INTRAVENOUS | Status: AC
  Filled 2014-01-09: qty 50

## 2014-01-09 MED ORDER — ROCURONIUM BROMIDE 100 MG/10ML IV SOLN
INTRAVENOUS | Status: DC | PRN
  Administered 2014-01-09: 20 mg via INTRAVENOUS
  Administered 2014-01-09: 10 mg via INTRAVENOUS

## 2014-01-09 MED ORDER — ONDANSETRON HCL 4 MG/2ML IJ SOLN: 4.0000 mg | Freq: Four times a day (QID) | INTRAMUSCULAR | Status: DC | PRN

## 2014-01-09 MED ORDER — MEPERIDINE HCL 50 MG/ML IJ SOLN: 6.2500 mg | INTRAMUSCULAR | Status: DC | PRN

## 2014-01-09 MED ORDER — PROMETHAZINE HCL 25 MG/ML IJ SOLN: 6.2500 mg | INTRAMUSCULAR | Status: DC | PRN

## 2014-01-09 MED ORDER — LACTATED RINGERS IV SOLN
INTRAVENOUS | Status: DC | PRN
  Administered 2014-01-09 (×2): via INTRAVENOUS

## 2014-01-09 MED ORDER — IOHEXOL 300 MG/ML  SOLN
INTRAMUSCULAR | Status: DC | PRN
  Administered 2014-01-09: 10:00:00

## 2014-01-09 MED ORDER — POTASSIUM CHLORIDE IN NACL 20-0.9 MEQ/L-% IV SOLN
INTRAVENOUS | Status: DC
  Administered 2014-01-09 – 2014-01-10 (×2): 100 mL via INTRAVENOUS
  Filled 2014-01-09 (×4): qty 1000

## 2014-01-09 MED ORDER — HEPARIN SODIUM (PORCINE) 5000 UNIT/ML IJ SOLN
5000.0000 [IU] | Freq: Three times a day (TID) | INTRAMUSCULAR | Status: DC
  Administered 2014-01-09 – 2014-01-10 (×2): 5000 [IU] via SUBCUTANEOUS
  Filled 2014-01-09 (×5): qty 1

## 2014-01-09 MED ORDER — MORPHINE SULFATE 2 MG/ML IJ SOLN
2.0000 mg | INTRAMUSCULAR | Status: DC | PRN
  Administered 2014-01-09: 2 mg via INTRAVENOUS
  Filled 2014-01-09: qty 1

## 2014-01-09 MED ORDER — SUCCINYLCHOLINE CHLORIDE 20 MG/ML IJ SOLN
INTRAMUSCULAR | Status: DC | PRN
  Administered 2014-01-09: 100 mg via INTRAVENOUS

## 2014-01-09 MED ORDER — GLYCOPYRROLATE 0.2 MG/ML IJ SOLN
INTRAMUSCULAR | Status: DC | PRN
  Administered 2014-01-09: .6 mg via INTRAVENOUS

## 2014-01-09 MED ORDER — LACTATED RINGERS IV SOLN
INTRAVENOUS | Status: DC
  Administered 2014-01-09: 1000 mL via INTRAVENOUS

## 2014-01-09 MED ORDER — POTASSIUM CHLORIDE IN NACL 20-0.9 MEQ/L-% IV SOLN
INTRAVENOUS | Status: DC
  Administered 2014-01-09: 100 mL via INTRAVENOUS
  Filled 2014-01-09 (×2): qty 1000

## 2014-01-09 MED ORDER — FENTANYL CITRATE 0.05 MG/ML IJ SOLN
INTRAMUSCULAR | Status: DC | PRN
  Administered 2014-01-09: 100 ug via INTRAVENOUS
  Administered 2014-01-09 (×4): 50 ug via INTRAVENOUS
  Administered 2014-01-09: 100 ug via INTRAVENOUS
  Administered 2014-01-09: 50 ug via INTRAVENOUS

## 2014-01-09 MED ORDER — NEOSTIGMINE METHYLSULFATE 10 MG/10ML IV SOLN
INTRAVENOUS | Status: AC
  Filled 2014-01-09: qty 1

## 2014-01-09 MED ORDER — OXYCODONE HCL 5 MG PO TABS: 5.0000 mg | ORAL_TABLET | Freq: Once | ORAL | Status: DC | PRN

## 2014-01-09 MED ORDER — ONDANSETRON HCL 4 MG PO TABS: 4.0000 mg | ORAL_TABLET | Freq: Four times a day (QID) | ORAL | Status: DC | PRN

## 2014-01-09 MED ORDER — DEXTROSE 5 % IV SOLN
1.0000 g | Freq: Every day | INTRAVENOUS | Status: DC
  Administered 2014-01-09: 1 g via INTRAVENOUS
  Filled 2014-01-09: qty 10

## 2014-01-09 MED ORDER — DEXAMETHASONE SODIUM PHOSPHATE 10 MG/ML IJ SOLN
INTRAMUSCULAR | Status: DC | PRN
  Administered 2014-01-09: 10 mg via INTRAVENOUS

## 2014-01-09 MED ORDER — CEFAZOLIN SODIUM-DEXTROSE 2-3 GM-% IV SOLR
INTRAVENOUS | Status: DC | PRN
  Administered 2014-01-09: 2 g via INTRAVENOUS

## 2014-01-09 MED ORDER — LIDOCAINE HCL (CARDIAC) 20 MG/ML IV SOLN
INTRAVENOUS | Status: DC | PRN
  Administered 2014-01-09: 50 mg via INTRAVENOUS

## 2014-01-09 MED ORDER — PROPOFOL 10 MG/ML IV BOLUS
INTRAVENOUS | Status: AC
  Filled 2014-01-09: qty 20

## 2014-01-09 MED ORDER — CHLORHEXIDINE GLUCONATE 4 % EX LIQD
1.0000 "application " | Freq: Once | CUTANEOUS | Status: AC
  Administered 2014-01-09: 1 via TOPICAL
  Filled 2014-01-09: qty 15

## 2014-01-09 MED ORDER — BUPIVACAINE-EPINEPHRINE 0.5% -1:200000 IJ SOLN
INTRAMUSCULAR | Status: DC | PRN
  Administered 2014-01-09: 21 mL

## 2014-01-09 MED ORDER — HEPARIN SODIUM (PORCINE) 5000 UNIT/ML IJ SOLN
5000.0000 [IU] | Freq: Three times a day (TID) | INTRAMUSCULAR | Status: DC
  Administered 2014-01-09: 5000 [IU] via SUBCUTANEOUS
  Filled 2014-01-09 (×4): qty 1

## 2014-01-09 MED ORDER — HYDROMORPHONE HCL PF 1 MG/ML IJ SOLN
INTRAMUSCULAR | Status: AC
  Filled 2014-01-09: qty 1

## 2014-01-09 MED ORDER — HYDROMORPHONE HCL PF 1 MG/ML IJ SOLN
0.2500 mg | INTRAMUSCULAR | Status: DC | PRN
  Administered 2014-01-09 (×2): 0.5 mg via INTRAVENOUS

## 2014-01-09 MED ORDER — MIDAZOLAM HCL 5 MG/5ML IJ SOLN
INTRAMUSCULAR | Status: DC | PRN
  Administered 2014-01-09: 2 mg via INTRAVENOUS

## 2014-01-09 MED ORDER — METOCLOPRAMIDE HCL 5 MG/ML IJ SOLN
INTRAMUSCULAR | Status: DC | PRN
  Administered 2014-01-09: 10 mg via INTRAVENOUS

## 2014-01-09 MED ORDER — OXYCODONE-ACETAMINOPHEN 5-325 MG PO TABS
1.0000 | ORAL_TABLET | ORAL | Status: DC | PRN
  Administered 2014-01-09 (×2): 1 via ORAL
  Administered 2014-01-10 (×2): 2 via ORAL
  Filled 2014-01-09: qty 2
  Filled 2014-01-09 (×2): qty 1
  Filled 2014-01-09: qty 2

## 2014-01-09 MED ORDER — LIDOCAINE HCL (CARDIAC) 20 MG/ML IV SOLN
INTRAVENOUS | Status: AC
  Filled 2014-01-09: qty 5

## 2014-01-09 MED ORDER — ROCURONIUM BROMIDE 100 MG/10ML IV SOLN
INTRAVENOUS | Status: AC
  Filled 2014-01-09: qty 1

## 2014-01-09 MED ORDER — HYDROMORPHONE HCL PF 1 MG/ML IJ SOLN
1.0000 mg | Freq: Once | INTRAMUSCULAR | Status: AC
  Administered 2014-01-09: 1 mg via INTRAVENOUS
  Filled 2014-01-09: qty 1

## 2014-01-09 SURGICAL SUPPLY — 40 items
ADH SKN CLS APL DERMABOND .7 (GAUZE/BANDAGES/DRESSINGS) ×1
APL SKNCLS STERI-STRIP NONHPOA (GAUZE/BANDAGES/DRESSINGS)
APPLIER CLIP ROT 10 11.4 M/L (STAPLE) ×2
APR CLP MED LRG 11.4X10 (STAPLE) ×1
BAG SPEC RTRVL LRG 6X4 10 (ENDOMECHANICALS) ×1
BENZOIN TINCTURE PRP APPL 2/3 (GAUZE/BANDAGES/DRESSINGS) IMPLANT
CANISTER SUCTION 2500CC (MISCELLANEOUS) ×2 IMPLANT
CLIP APPLIE ROT 10 11.4 M/L (STAPLE) ×1 IMPLANT
COVER MAYO STAND STRL (DRAPES) ×2 IMPLANT
DECANTER SPIKE VIAL GLASS SM (MISCELLANEOUS) ×1 IMPLANT
DERMABOND ADVANCED (GAUZE/BANDAGES/DRESSINGS) ×1
DERMABOND ADVANCED .7 DNX12 (GAUZE/BANDAGES/DRESSINGS) ×1 IMPLANT
DRAPE C-ARM 42X120 X-RAY (DRAPES) ×2 IMPLANT
DRAPE LAPAROSCOPIC ABDOMINAL (DRAPES) ×2 IMPLANT
ELECT REM PT RETURN 9FT ADLT (ELECTROSURGICAL) ×2
ELECTRODE REM PT RTRN 9FT ADLT (ELECTROSURGICAL) ×1 IMPLANT
GLOVE BIOGEL PI IND STRL 6.5 (GLOVE) IMPLANT
GLOVE BIOGEL PI IND STRL 8 (GLOVE) IMPLANT
GLOVE BIOGEL PI INDICATOR 6.5 (GLOVE) ×3
GLOVE BIOGEL PI INDICATOR 8 (GLOVE) ×1
GLOVE ECLIPSE 8.0 STRL XLNG CF (GLOVE) ×1 IMPLANT
GLOVE EUDERMIC 7 POWDERFREE (GLOVE) ×2 IMPLANT
GOWN STRL REUS W/TWL XL LVL3 (GOWN DISPOSABLE) ×8 IMPLANT
HEMOSTAT SNOW SURGICEL 2X4 (HEMOSTASIS) IMPLANT
KIT BASIN OR (CUSTOM PROCEDURE TRAY) ×2 IMPLANT
POUCH SPECIMEN RETRIEVAL 10MM (ENDOMECHANICALS) ×2 IMPLANT
SCISSORS LAP 5X35 DISP (ENDOMECHANICALS) ×2 IMPLANT
SET CHOLANGIOGRAPH MIX (MISCELLANEOUS) ×2 IMPLANT
SET IRRIG TUBING LAPAROSCOPIC (IRRIGATION / IRRIGATOR) ×2 IMPLANT
SLEEVE XCEL OPT CAN 5 100 (ENDOMECHANICALS) ×2 IMPLANT
SOLUTION ANTI FOG 6CC (MISCELLANEOUS) ×2 IMPLANT
STRIP CLOSURE SKIN 1/2X4 (GAUZE/BANDAGES/DRESSINGS) IMPLANT
SUT MNCRL AB 4-0 PS2 18 (SUTURE) ×2 IMPLANT
TOWEL OR 17X26 10 PK STRL BLUE (TOWEL DISPOSABLE) ×2 IMPLANT
TOWEL OR NON WOVEN STRL DISP B (DISPOSABLE) ×2 IMPLANT
TRAY LAP CHOLE (CUSTOM PROCEDURE TRAY) ×2 IMPLANT
TROCAR BLADELESS OPT 5 100 (ENDOMECHANICALS) ×2 IMPLANT
TROCAR XCEL BLUNT TIP 100MML (ENDOMECHANICALS) ×2 IMPLANT
TROCAR XCEL NON-BLD 11X100MML (ENDOMECHANICALS) ×2 IMPLANT
TUBING INSUFFLATION 10FT LAP (TUBING) ×2 IMPLANT

## 2014-01-09 NOTE — MAU Provider Note (Signed)
Attestation of Attending Supervision of Advanced Practitioner (CNM/NP): Evaluation and management procedures were performed by the Advanced Practitioner under my supervision and collaboration.  I have reviewed the Advanced Practitioner's note and chart, and I agree with the management and plan.  Dontrel Smethers 01/09/2014 1:27 AM   

## 2014-01-09 NOTE — Progress Notes (Signed)
Subjective: Admitted by Dr. Caswell Corwin last line for early acute cholecystitis. She feels better this morning. No more vomiting. Pain is less.Lab work is normal. Ultrasound shows small gallstones. Gallbladder wall thickening.CBD 5.3 mm.  Objective: Vital signs in last 24 hours: Temp:  [98 F (36.7 C)-99.6 F (37.6 C)] 98 F (36.7 C) (08/25 0400) Pulse Rate:  [63-80] 66 (08/25 0400) Resp:  [16-20] 16 (08/25 0400) BP: (95-138)/(49-87) 95/49 mmHg (08/25 0400) SpO2:  [98 %-100 %] 100 % (08/25 0400) Weight:  [288 lb (130.636 kg)] 288 lb (130.636 kg) (08/24 1902) Last BM Date: 01/08/14  Intake/Output from previous day:   Intake/Output this shift:    General appearance: pleasant. Alert. No distress. Cooperative. Mental status normal. Resp: clear to auscultation bilaterally GI: obese. Not distended. Soft. Subjectively tender in right upper quadrant but no peritoneal irritation. No mass. No scars or hernias seen.  Lab Results:   Recent Labs  01/08/14 1948 01/09/14 0520  WBC 5.0 3.9*  HGB 11.4* 10.7*  HCT 35.7* 34.8*  PLT 262 249   BMET  Recent Labs  01/08/14 1948 01/09/14 0520  NA 141  --   K 4.0  --   CL 103  --   CO2 28  --   GLUCOSE 95  --   BUN 11  --   CREATININE 0.84 0.78  CALCIUM 9.0  --    PT/INR No results found for this basename: LABPROT, INR,  in the last 72 hours ABG No results found for this basename: PHART, PCO2, PO2, HCO3,  in the last 72 hours  Studies/Results: US Abdomen Complete  01/09/2014   CLINICAL DATA:  Right upper quadrant pain.  EXAM: ULTRASOUND ABDOMEN COMPLETE  COMPARISON:  None.  FINDINGS: Gallbladder:  Mildly distended gallbladder. Small stones layering in the dependent portion. Gallbladder wall thickening and edema with wall thickness measured at 7.7 mm. Murphy's sign is positive.  Common bile duct:  Diameter: 5.3 mm, normal  Liver:  Mildly increased hepatic parenchymal echotexture homogeneous lead suggesting diffuse fatty infiltration.   IVC:  No abnormality visualized.  Pancreas:  Visualized portion unremarkable.  Spleen:  Size and appearance within normal limits.  Right Kidney:  Length: 10.7 cm. Echogenicity within normal limits. No mass or hydronephrosis visualized.  Left Kidney:  Length: 10.6 cm. Echogenicity within normal limits. No mass or hydronephrosis visualized.  Abdominal aorta:  No aneurysm visualized.  Other findings:  None.  IMPRESSION: Cholelithiasis with gallbladder wall thickening and positive Murphy's sign. Appearance is consistent with acute cholecystitis in the appropriate clinical setting. Probable fatty infiltration of the liver.   Electronically Signed   By: Burman Nieves M.D.   On: 01/09/2014 00:27    Anti-infectives: Anti-infectives   Start     Dose/Rate Route Frequency Ordered Stop   01/09/14 0415  cefTRIAXone (ROCEPHIN) 1 g in dextrose 5 % 50 mL IVPB     1 g 100 mL/hr over 30 Minutes Intravenous Daily 01/09/14 0359        Assessment/Plan:  Chronic cholecystitis with cholelithiasis. Accelerating biliary colic. Possible early acute cholecystitis by ultrasound and exam. On IV Rocephin  I have offered to proceed with laparoscopic cholecystectomy, possible cholangiogram, possible open cholecystectomy today. She is in favor of this.  I discussed the indications, details, techniques, and numerous risk of the surgery with her. She's aware of the risk of bleeding, infection, conversion to open laparotomy, bile leak, wound hernia, injury to adjacent organs with major reconstructive surgery, and other unforeseen problems. She understands these  issues well. All of her questions are answered. She agrees with this plan.  Operating room has been notified and we'll plan to proceed with surgery today.   LOS: 1 day    Kamdin Follett M 01/09/2014

## 2014-01-09 NOTE — H&P (Signed)
Ariana Rice is an 28 y.o. female.    Chief Complaint: Abdominal pain  HPI: Patient had the onset of severe crampy and pressure-like epigastric pain this morning now about 12 hours ago. This was associated with nausea and vomiting. It improved a little and then she ate this afternoon and the pain got worse again and she presented to the emergency department. She has had several episodes of similar pain over the last few months have resolved on around without seeking medical attention. She has had pain medication the emergency department And is feeling better but still having moderate epigastric pain. No fever chills or jaundice. No GU symptoms. Patient was pregnant and delivered her child about one year ago.  Past Medical History  Diagnosis Date  . Anginal pain     intermittent- had cardiac workup- unknown cause    Past Surgical History  Procedure Laterality Date  . No past surgeries      Family History  Problem Relation Age of Onset  . Other Neg Hx   . Hypertension Mother   . Diabetes Father   . Diabetes Maternal Grandmother   . Kidney disease Maternal Grandmother    Social History:  reports that she quit smoking about 6 years ago. She has never used smokeless tobacco. She reports that she does not drink alcohol or use illicit drugs.  Allergies: No Known Allergies   (Not in a hospital admission)  Results for orders placed during the hospital encounter of 01/08/14 (from the past 48 hour(s))  URINALYSIS, ROUTINE W REFLEX MICROSCOPIC     Status: Abnormal   Collection Time    01/08/14  7:00 PM      Result Value Ref Range   Color, Urine YELLOW  YELLOW   APPearance CLEAR  CLEAR   Specific Gravity, Urine 1.025  1.005 - 1.030   pH 6.0  5.0 - 8.0   Glucose, UA NEGATIVE  NEGATIVE mg/dL   Hgb urine dipstick SMALL (*) NEGATIVE   Bilirubin Urine NEGATIVE  NEGATIVE   Ketones, ur 15 (*) NEGATIVE mg/dL   Protein, ur NEGATIVE  NEGATIVE mg/dL   Urobilinogen, UA 1.0  0.0 - 1.0 mg/dL   Nitrite NEGATIVE  NEGATIVE   Leukocytes, UA SMALL (*) NEGATIVE  URINE MICROSCOPIC-ADD ON     Status: Abnormal   Collection Time    01/08/14  7:00 PM      Result Value Ref Range   Squamous Epithelial / LPF MANY (*) RARE   WBC, UA 7-10  <3 WBC/hpf   RBC / HPF 3-6  <3 RBC/hpf   Bacteria, UA MANY (*) RARE   Urine-Other MUCOUS PRESENT    POCT PREGNANCY, URINE     Status: None   Collection Time    01/08/14  7:17 PM      Result Value Ref Range   Preg Test, Ur NEGATIVE  NEGATIVE   Comment:            THE SENSITIVITY OF THIS     METHODOLOGY IS >24 mIU/mL  COMPREHENSIVE METABOLIC PANEL     Status: None   Collection Time    01/08/14  7:48 PM      Result Value Ref Range   Sodium 141  137 - 147 mEq/L   Potassium 4.0  3.7 - 5.3 mEq/L   Chloride 103  96 - 112 mEq/L   CO2 28  19 - 32 mEq/L   Glucose, Bld 95  70 - 99 mg/dL   BUN 11  6 - 23 mg/dL   Creatinine, Ser 0.84  0.50 - 1.10 mg/dL   Calcium 9.0  8.4 - 10.5 mg/dL   Total Protein 7.3  6.0 - 8.3 g/dL   Albumin 3.8  3.5 - 5.2 g/dL   AST 22  0 - 37 U/L   ALT 22  0 - 35 U/L   Alkaline Phosphatase 61  39 - 117 U/L   Total Bilirubin 0.3  0.3 - 1.2 mg/dL   GFR calc non Af Amer >90  >90 mL/min   GFR calc Af Amer >90  >90 mL/min   Comment: (NOTE)     The eGFR has been calculated using the CKD EPI equation.     This calculation has not been validated in all clinical situations.     eGFR's persistently <90 mL/min signify possible Chronic Kidney     Disease.   Anion gap 10  5 - 15  CBC WITH DIFFERENTIAL     Status: Abnormal   Collection Time    01/08/14  7:48 PM      Result Value Ref Range   WBC 5.0  4.0 - 10.5 K/uL   RBC 4.20  3.87 - 5.11 MIL/uL   Hemoglobin 11.4 (*) 12.0 - 15.0 g/dL   HCT 35.7 (*) 36.0 - 46.0 %   MCV 85.0  78.0 - 100.0 fL   MCH 27.1  26.0 - 34.0 pg   MCHC 31.9  30.0 - 36.0 g/dL   RDW 14.1  11.5 - 15.5 %   Platelets 262  150 - 400 K/uL   Neutrophils Relative % 54  43 - 77 %   Neutro Abs 2.7  1.7 - 7.7 K/uL    Lymphocytes Relative 36  12 - 46 %   Lymphs Abs 1.8  0.7 - 4.0 K/uL   Monocytes Relative 9  3 - 12 %   Monocytes Absolute 0.5  0.1 - 1.0 K/uL   Eosinophils Relative 1  0 - 5 %   Eosinophils Absolute 0.1  0.0 - 0.7 K/uL   Basophils Relative 0  0 - 1 %   Basophils Absolute 0.0  0.0 - 0.1 K/uL  LIPASE, BLOOD     Status: None   Collection Time    01/08/14 10:33 PM      Result Value Ref Range   Lipase 16  11 - 59 U/L   US Abdomen Complete  01/09/2014   CLINICAL DATA:  Right upper quadrant pain.  EXAM: ULTRASOUND ABDOMEN COMPLETE  COMPARISON:  None.  FINDINGS: Gallbladder:  Mildly distended gallbladder. Small stones layering in the dependent portion. Gallbladder wall thickening and edema with wall thickness measured at 7.7 mm. Murphy's sign is positive.  Common bile duct:  Diameter: 5.3 mm, normal  Liver:  Mildly increased hepatic parenchymal echotexture homogeneous lead suggesting diffuse fatty infiltration.  IVC:  No abnormality visualized.  Pancreas:  Visualized portion unremarkable.  Spleen:  Size and appearance within normal limits.  Right Kidney:  Length: 10.7 cm. Echogenicity within normal limits. No mass or hydronephrosis visualized.  Left Kidney:  Length: 10.6 cm. Echogenicity within normal limits. No mass or hydronephrosis visualized.  Abdominal aorta:  No aneurysm visualized.  Other findings:  None.  IMPRESSION: Cholelithiasis with gallbladder wall thickening and positive Murphy's sign. Appearance is consistent with acute cholecystitis in the appropriate clinical setting. Probable fatty infiltration of the liver.   Electronically Signed   By: Lucienne Capers M.D.   On: 01/09/2014 00:27    Review  of Systems  Constitutional: Negative for fever and chills.  Respiratory: Negative.   Cardiovascular: Negative.   Gastrointestinal: Positive for nausea, vomiting and abdominal pain. Negative for diarrhea and constipation.  Genitourinary: Negative.   Musculoskeletal: Negative.     Blood  pressure 128/83, pulse 77, temperature 98 F (36.7 C), temperature source Oral, resp. rate 18, height $RemoveBe'5\' 7"'[id number]$  (1.702 m), weight 288 lb (130.636 kg), last menstrual period 09/08/2013, SpO2 100.00%, not currently breastfeeding. Physical Exam   General: Alert, Obese young African American female, in no distress Skin: Warm and dry without rash or infection. HEENT: No palpable masses or thyromegaly. Sclera nonicteric. Pupils equal round and reactive. ar. Lymph nodes: No cervical, supraclavicular,  nodes palpable. Lungs: Breath sounds clear and equal without increased work of breathing Cardiovascular: Regular rate and rhythm without murmur. No JVD or edema. Peripheral pulses intact. Abdomen: Nondistended. Moderate epigastric tenderness without guarding No masses palpable. No organomegaly. No palpable hernias. Extremities: No edema or joint swelling or deformity. No chronic venous stasis changes. Neurologic: Alert and fully oriented. No gross motor deficits  Assessment/Plan Cholelithiasis with repeated episodes of biliary colic and now presents with persistent pain and possibly early acute cholecystitis. Recommended admission for cholecystectomy and cholangiogram she is in agreement. We'll cover with antibiotics for possible early cholecystitis.  Labrina Lines T 01/09/2014, 1:40 AM

## 2014-01-09 NOTE — Anesthesia Preprocedure Evaluation (Signed)
Anesthesia Evaluation  Patient identified by MRN, date of birth, ID band Patient awake    Reviewed: Allergy & Precautions, H&P , NPO status , Patient's Chart, lab work & pertinent test results  Airway Mallampati: II TM Distance: >3 FB Neck ROM: Full    Dental no notable dental hx.    Pulmonary neg pulmonary ROS, former smoker,  breath sounds clear to auscultation  Pulmonary exam normal       Cardiovascular - anginanegative cardio ROS  Rhythm:Regular Rate:Normal     Neuro/Psych negative neurological ROS  negative psych ROS   GI/Hepatic negative GI ROS, Neg liver ROS,   Endo/Other  Morbid obesity  Renal/GU negative Renal ROS     Musculoskeletal negative musculoskeletal ROS (+)   Abdominal (+) + obese,   Peds  Hematology negative hematology ROS (+)   Anesthesia Other Findings   Reproductive/Obstetrics negative OB ROS                           Anesthesia Physical Anesthesia Plan  ASA: III  Anesthesia Plan: General   Post-op Pain Management:    Induction: Intravenous  Airway Management Planned: Oral ETT  Additional Equipment:   Intra-op Plan:   Post-operative Plan: Extubation in OR  Informed Consent: I have reviewed the patients History and Physical, chart, labs and discussed the procedure including the risks, benefits and alternatives for the proposed anesthesia with the patient or authorized representative who has indicated his/her understanding and acceptance.   Dental advisory given  Plan Discussed with: CRNA  Anesthesia Plan Comments:         Anesthesia Quick Evaluation

## 2014-01-09 NOTE — Transfer of Care (Signed)
Immediate Anesthesia Transfer of Care Note  Patient: Ariana Rice  Procedure(s) Performed: Procedure(s): LAPAROSCOPIC CHOLECYSTECTOMY WITH INTRAOPERATIVE CHOLANGIOGRAM (N/A)  Patient Location: PACU  Anesthesia Type:General  Level of Consciousness: awake, alert , oriented and patient cooperative  Airway & Oxygen Therapy: Patient Spontanous Breathing and Patient connected to face mask oxygen  Post-op Assessment: Report given to PACU RN and Post -op Vital signs reviewed and stable  Post vital signs: Reviewed and stable  Complications: No apparent anesthesia complications

## 2014-01-09 NOTE — Anesthesia Postprocedure Evaluation (Signed)
Anesthesia Post Note  Patient: Ariana Rice  Procedure(s) Performed: Procedure(s) (LRB): LAPAROSCOPIC CHOLECYSTECTOMY WITH INTRAOPERATIVE CHOLANGIOGRAM (N/A)  Anesthesia type: General  Patient location: PACU  Post pain: Pain level controlled  Post assessment: Post-op Vital signs reviewed  Last Vitals: BP 116/71  Pulse 73  Temp(Src) 36.8 C (Oral)  Resp 12  Ht  (1.702 m)  Wt 288 lb (130.636 kg)  BMI 45.10 kg/m2  SpO2 95%  LMP 09/08/2013  Breastfeeding? No  Post vital signs: Reviewed  Level of consciousness: sedated  Complications: No apparent anesthesia complications

## 2014-01-09 NOTE — Op Note (Signed)
Patient Name:           Ariana Rice   Date of Surgery:        01/09/2014  Note: This dictation was prepared with Dragon/digital dictation along with [facility] technology. Any transcriptional errors that result from this process are unintentional.   Pre op Diagnosis:      Acute cholecystitis with cholelithiasis  Post op Diagnosis:    Acute cholecystitis with cholelithiasis  Procedure:                 Laparoscopic cholecystectomy with cholangiogram  Surgeon:                     Angelia Mould. Derrell Lolling, M.D., FACS  Assistant:                      Karie Soda, M.D.  Operative Indications:   This is a 28 year old woman who has had several episodes of upper abdominal pain after meals. She presented to the emergency room last night with a 12 hour history of more severe upper abdominal pain,  epigastrium and right upper quadrant, nausea and vomiting. Ultrasound showed gallbladder wall thickening and gallstones. Lab work was normal. She was found to be tender in the RUQ  on exam. She was admitted and started on antibiotics. She was offered cholecystectomy today and counseled regarding that. She is brought to the operating room for cholecystectomy  Operative Findings:       The gallbladder was edematous and large and dilated but there was no evidence of exudate and no evidence of gangrene. The anatomy of the cystic duct, cystic artery and common bile duct were conventional. The cholangiogram was normal, showing normal intrahepatic and extrahepatic biliary anatomy, no filling defect, no dilatation, and no obstruction with good flow of contrast into the duodenum. The liver, stomach, duodenum, colon, and some bowel looked normal on exam.  Procedure in Detail:          Following the induction of general endotracheal anesthesia the patient's abdomen was prepped and draped in a sterile fashion. Intravenous antibiotics were given. Surgical time out was performed. 0.5% Marcaine with epinephrine was used as local  infiltration anesthetic. A vertical incision was made at the lower rim of the umbilicus. The fascia was incised in the midline and the abdominal cavity entered under direct vision. An 11 mm Hassan trocar was inserted and secured with the purse string suture of 0 Vicryl. Pneumoperitoneum was created and videocamera was inserted.       An 11 mm trocar was placed in the subxiphoid region and two 5 mm trocars placed in the right upper quadrant. The gallbladder was identified, retracted. The peritoneum overlying the infundibulum was incised mobilizing the neck of the gallbladder. We were able  to get behind the cystic duct and created a large window and a  good critical view. A cholangiogram catheter was inserted into the cystic duct and a cholangiogram was obtained with the C-arm. The cholangiogram was normal as described above. The cholangiogram catheter was removed, the cystic duct secured with multiple endoclips and divided. The cystic artery was secured with multiple metal clips and divided. The gallbladder was dissected from its bed with electrocautery placed in a specimen bag and removed. The operative field was irrigated and inspected. Hemostasis was excellent. At the completion of the case there was no bleeding and no bile leak.     The trocars were removed. There was no bleeding. The pneumoperitoneum  was released. The fascia at the umbilicus was closed with 0 Vicryl sutures and the skin incisions closed with subcuticular sutures of 4-0 Monocryl and Dermabond. The patient tolerated the procedure well was taken to PACU in stable condition. EBL 10 cc. Counts correct. Complications none.     Angelia Mould. Derrell Lolling, M.D., FACS General and Minimally Invasive Surgery Breast and Colorectal Surgery  01/09/2014 10:17 AM

## 2014-01-09 NOTE — Discharge Instructions (Signed)
Laparoscopic Cholecystectomy, Care After °Refer to this sheet in the next few weeks. These instructions provide you with information on caring for yourself after your procedure. Your health care provider may also give you more specific instructions. Your treatment has been planned according to current medical practices, but problems sometimes occur. Call your health care provider if you have any problems or questions after your procedure. °WHAT TO EXPECT AFTER THE PROCEDURE °After your procedure, it is typical to have the following: °· Pain at your incision sites. You will be given pain medicines to control the pain. °· Mild nausea or vomiting. This should improve after the first 24 hours. °· Bloating and possibly shoulder pain from the gas used during the procedure. This will improve after the first 24 hours. °HOME CARE INSTRUCTIONS  °· Change bandages (dressings) as directed by your health care provider. °· Keep the wound dry and clean. You may wash the wound gently with soap and water. Gently blot or dab the area dry. °· Do not take baths or use swimming pools or hot tubs for 2 weeks or until your health care provider approves. °· Only take over-the-counter or prescription medicines as directed by your health care provider. °· Continue your normal diet as directed by your health care provider. °· Do not lift anything heavier than 10 pounds (4.5 kg) until your health care provider approves. °· Do not play contact sports for 1 week or until your health care provider approves. °SEEK MEDICAL CARE IF:  °· You have redness, swelling, or increasing pain in the wound. °· You notice yellowish-white fluid (pus) coming from the wound. °· You have drainage from the wound that lasts longer than 1 day. °· You notice a bad smell coming from the wound or dressing. °· Your surgical cuts (incisions) break open. °SEEK IMMEDIATE MEDICAL CARE IF:  °· You develop a rash. °· You have difficulty breathing. °· You have chest pain. °· You  have a fever. °· You have increasing pain in the shoulders (shoulder strap areas). °· You have dizzy episodes or faint while standing. °· You have severe abdominal pain. °· You feel sick to your stomach (nauseous) or throw up (vomit) and this lasts for more than 1 day. °Document Released: 05/04/2005 Document Revised: 02/22/2013 Document Reviewed: 12/14/2012 °ExitCare® Patient Information ©2015 [employer]. This information is not intended to replace advice given to you by your health care provider. Make sure you discuss any questions you have with your health care provider. ° °[facility] °LAPAROSCOPIC SURGERY: POST OP INSTRUCTIONS °Always review your discharge instruction sheet given to you by the facility where your surgery was performed. °IF YOU HAVE DISABILITY OR FAMILY LEAVE FORMS, YOU MUST BRING THEM TO THE OFFICE FOR PROCESSING.   °DO NOT GIVE THEM TO YOUR DOCTOR. ° °1. A prescription for pain medication may be given to you upon discharge.  Take your pain medication as prescribed, if needed.  If narcotic pain medicine is not needed, then you may take acetaminophen (Tylenol) or ibuprofen (Advil) as needed. °2. Take your usually prescribed medications unless otherwise directed. °3. If you need a refill on your pain medication, please contact your pharmacy.  They will contact our office to request authorization. Prescriptions will not be filled after 5pm or on week-ends. °4. You should follow a light diet the first few days after arrival home, such as soup and crackers, etc.  Be sure to include lots of fluids daily. °5. Most patients will experience some   swelling and bruising in the area of the incisions.  Ice packs will help.  Swelling and bruising can take several days to resolve.  °6. It is common to experience some constipation if taking pain medication after surgery.  Increasing fluid intake and taking a stool softener (such as Colace) will usually help or prevent this problem  from occurring.  A mild laxative (Milk of Magnesia or Miralax) should be taken according to package instructions if there are no bowel movements after 48 hours. °7. Unless discharge instructions indicate otherwise, you may remove your bandages 24-48 hours after surgery, and you may shower at that time.  You may have steri-strips (small skin tapes) in place directly over the incision.  These strips should be left on the skin for 7-10 days.  If your surgeon used skin glue on the incision, you may shower in 24 hours.  The glue will flake off over the next 2-3 weeks.  Any sutures or staples will be removed at the office during your follow-up visit. °8. ACTIVITIES:  You may resume regular (light) daily activities beginning the next day--such as daily self-care, walking, climbing stairs--gradually increasing activities as tolerated.  You may have sexual intercourse when it is comfortable.  Refrain from any heavy lifting or straining until approved by your doctor. °a. You may drive when you are no longer taking prescription pain medication, you can comfortably wear a seatbelt, and you can safely maneuver your car and apply brakes. °b. RETURN TO WORK:  __________________________________________________________ °9. You should see your doctor in the office for a follow-up appointment approximately 2-3 weeks after your surgery.  Make sure that you call for this appointment within a day or two after you arrive home to insure a convenient appointment time. °10. OTHER INSTRUCTIONS: __________________________________________________________________________________________________________________________ __________________________________________________________________________________________________________________________ °WHEN TO CALL YOUR DOCTOR: °1. Fever over 101.0 °2. Inability to urinate °3. Continued bleeding from incision. °4. Increased pain, redness, or drainage from the incision. °5. Increasing abdominal pain ° °The  clinic staff is available to answer your questions during regular business hours.  Please don’t hesitate to call and ask to speak to one of the nurses for clinical concerns.  If you have a medical emergency, go to the nearest emergency room or call 911.  A surgeon from [facility] is always on call at the hospital. °[address], [address], [place], [place]  [address] ? [address] [address], [place], [place]   [address] °[phone] ? [phone] ? FAX [phone] °Web site: [email or web address] °

## 2014-01-10 ENCOUNTER — Encounter (HOSPITAL_COMMUNITY): Payer: Self-pay | Admitting: General Surgery

## 2014-01-10 ENCOUNTER — Encounter: Payer: Self-pay | Admitting: General Surgery

## 2014-01-10 MED ORDER — OXYCODONE-ACETAMINOPHEN 5-325 MG PO TABS: 1.0000 | ORAL_TABLET | ORAL | Status: AC | PRN

## 2014-01-10 NOTE — Progress Notes (Signed)
Patient interviewed and examined. Agree with above.  Angelia Mould. Derrell Lolling, M.D., [facility] General and Minimally invasive Surgery Breast and Colorectal Surgery Office:   [phone] Pager:   [phone]

## 2014-01-10 NOTE — Progress Notes (Signed)
The patient is doing well following her cholecystectomy yesterday. Ambulatory. Voiding. Tolerating diet. Pain well-controlled. Interested in going home today.  Afebrile. Vital signs stable. Abdomen soft. Wounds clean. Benign postop exam  We will plan to discharge her home after breakfast. Diet and activities discussed Followup in clinic in 2-3 weeks.   Angelia Mould. Derrell Lolling, M.D., [facility] General and Minimally invasive Surgery Breast and Colorectal Surgery Office:   [phone] Pager:   [phone]

## 2014-01-10 NOTE — Progress Notes (Signed)
Pt tolerating diet, walking voiding and states pain controlled with oral pain meds.  Discharge instructions discussed with patient and mother. Allowed time for questions. Able to answer questions able follow up appointment, pain meds and when to call md.

## 2014-01-10 NOTE — ED Provider Notes (Signed)
Medical screening examination/treatment/procedure(s) were performed by non-physician practitioner and as supervising physician I was immediately available for consultation/collaboration.   EKG Interpretation   Date/Time:  Monday January 08 2014 22:23:19 EDT Ventricular Rate:  77 PR Interval:  184 QRS Duration: 109 QT Interval:  428 QTC Calculation: 484 R Axis:   -18 Text Interpretation:  Sinus rhythm Borderline left axis deviation  Borderline prolonged QT interval Baseline wander No old tracing to compare  Confirmed by [facility]  MD, Nicholos Johns [phone]) on 01/08/2014 10:31:25 PM        Samuel Jester, DO 01/10/14 1607

## 2014-01-10 NOTE — Progress Notes (Signed)
1 Day Post-Op  Subjective: She has her breakfast and is doing well.  We will let her get up and walk around see how she does and send home later today.    Objective: Vital signs in last 24 hours: Temp:  [97.6 F (36.4 C)-98.5 F (36.9 C)] 97.6 F (36.4 C) (08/26 0515) Pulse Rate:  [60-97] 68 (08/26 0515) Resp:  [12-18] 18 (08/26 0515) BP: (93-129)/(52-82) 93/61 mmHg (08/26 0515) SpO2:  [95 %-100 %] 97 % (08/26 0515) Last BM Date: 01/08/14  Intake/Output from previous day: 08/25 0701 - 08/26 0700 In: 3526.7 [P.O.:300; I.V.:3226.7] Out: 1720 [Urine:1700; Blood:20] Intake/Output this shift:    General appearance: alert, cooperative and no distress Resp: clear to auscultation bilaterally GI: soft sore, sites look fine.  Lab Results:   Recent Labs  01/08/14 1948 01/09/14 0520  WBC 5.0 3.9*  HGB 11.4* 10.7*  HCT 35.7* 34.8*  PLT 262 249    BMET  Recent Labs  01/08/14 1948 01/09/14 0520  NA 141  --   K 4.0  --   CL 103  --   CO2 28  --   GLUCOSE 95  --   BUN 11  --   CREATININE 0.84 0.78  CALCIUM 9.0  --    PT/INR No results found for this basename: LABPROT, INR,  in the last 72 hours   Recent Labs Lab 01/08/14 1948  AST 22  ALT 22  ALKPHOS 61  BILITOT 0.3  PROT 7.3  ALBUMIN 3.8     Lipase     Component Value Date/Time   LIPASE 16 01/08/2014 2233     Studies/Results: Dg Cholangiogram Operative  01/09/2014   CLINICAL DATA:  Cholecystectomy  EXAM: INTRAOPERATIVE CHOLANGIOGRAM  TECHNIQUE: Cholangiographic images from the C-arm fluoroscopic device were submitted for interpretation post-operatively. Please see the procedural report for the amount of contrast and the fluoroscopy time utilized.  COMPARISON:  01/08/2014  FINDINGS: Intraoperative cholangiogram performed during the laparoscopic cholecystectomy. The intrahepatic ducts, biliary confluence, common hepatic duct, cystic duct, and common bile duct all appear patent. Negative for obstruction or  dilatation. No definite filling defect or stone.  IMPRESSION: Patent biliary system.   Electronically Signed   By: Ruel Favors M.D.   On: 01/09/2014 15:15   US Abdomen Complete  01/09/2014   CLINICAL DATA:  Right upper quadrant pain.  EXAM: ULTRASOUND ABDOMEN COMPLETE  COMPARISON:  None.  FINDINGS: Gallbladder:  Mildly distended gallbladder. Small stones layering in the dependent portion. Gallbladder wall thickening and edema with wall thickness measured at 7.7 mm. Murphy's sign is positive.  Common bile duct:  Diameter: 5.3 mm, normal  Liver:  Mildly increased hepatic parenchymal echotexture homogeneous lead suggesting diffuse fatty infiltration.  IVC:  No abnormality visualized.  Pancreas:  Visualized portion unremarkable.  Spleen:  Size and appearance within normal limits.  Right Kidney:  Length: 10.7 cm. Echogenicity within normal limits. No mass or hydronephrosis visualized.  Left Kidney:  Length: 10.6 cm. Echogenicity within normal limits. No mass or hydronephrosis visualized.  Abdominal aorta:  No aneurysm visualized.  Other findings:  None.  IMPRESSION: Cholelithiasis with gallbladder wall thickening and positive Murphy's sign. Appearance is consistent with acute cholecystitis in the appropriate clinical setting. Probable fatty infiltration of the liver.   Electronically Signed   By: Burman Nieves M.D.   On: 01/09/2014 00:27    Medications: . heparin  5,000 Units Subcutaneous 3 times per day  . pantoprazole (PROTONIX) IV  40 mg Intravenous QHS   .  0.9 % NaCl with KCl 20 mEq / L 100 mL (01/10/14 0544)   Prior to Admission medications   Medication Sig Start Date End Date Taking? Authorizing Provider  ibuprofen (ADVIL,MOTRIN) 200 MG tablet Take 600 mg by mouth every 6 (six) hours as needed for moderate pain.   Yes Historical Provider, MD  levonorgestrel (MIRENA) 20 MCG/24HR IUD 1 each by Intrauterine route once. Placed September 2014   Yes Historical Provider, MD  ibuprofen (ADVIL,MOTRIN)  600 MG tablet TAKE ONE TABLET BY MOUTH EVERY 6 HOURS AS NEEDED FOR PAIN 01/09/14   Brock Bad, MD     Assessment/Plan 1.  Acute cholecystitis with cholelithiasis Laparoscopic cholecystectomy with cholangiogram/01/09/2014/Haywood Grayce Sessions, MD 2.  Hx of anginal pain   Plan:  Home later this AM.       LOS: 2 days    [facility] 01/10/2014

## 2014-01-11 NOTE — Discharge Summary (Signed)
Physician Discharge Summary  Patient ID: Ariana Rice MRN: [id number] DOB/AGE: 29-11-1985 29 y.o.  Admit date: 01/08/2014 Discharge date: 01/11/2014  Admission Diagnoses:  1. Acute cholecystitis with cholelithiasis  2. Hx of anginal pain      Discharge Diagnoses:  Same Principal Problem:   Cholelithiasis and acute cholecystitis with obstruction Active Problems:   Cholelithiasis and acute cholecystitis without obstruction   PROCEDURES: aparoscopic cholecystectomy with cholangiogram/01/09/2014/Haywood Grayce Sessions, MD    Hospital Course: Patient had the onset of severe crampy and pressure-like epigastric pain this morning now about 12 hours ago. This was associated with nausea and vomiting. It improved a little and then she ate this afternoon and the pain got worse again and she presented to the emergency department. She has had several episodes of similar pain over the last few months have resolved on around without seeking medical attention. She has had pain medication the emergency department And is feeling better but still having moderate epigastric pain. No fever chills or jaundice. No GU symptoms. Patient was pregnant and delivered her child about one year ago.  She was admitted by by Dr. Johna Sheriff and taken the OR the following day by Dr. Derrell Lolling.  She did well and we advanced her diet.  She was ready for discharge the following day.  Follow up in our office 2-3 weeks.  Condition on d/c:  Improved  Disposition: 01-Home or Self Care     Medication List         ibuprofen 200 MG tablet  Commonly known as:  ADVIL,MOTRIN  Take 600 mg by mouth every 6 (six) hours as needed for moderate pain.     levonorgestrel 20 MCG/24HR IUD  Commonly known as:  MIRENA  1 each by Intrauterine route once. Placed September 2014     oxyCODONE-acetaminophen 5-325 MG per tablet  Commonly known as:  PERCOCET/ROXICET  Take 1-2 tablets by mouth every 4 (four) hours as needed for moderate pain.            Follow-up Information   Follow up with Ernestene Mention, MD. Schedule an appointment as soon as possible for a visit in 2 weeks. (Make an appointment for 2-3 weeks.)    Specialty:  General Surgery   Contact information:   [address] [place] [address] [phone]       Signed: Sherrie George 01/11/2014, 3:13 PM

## 2014-01-11 NOTE — Discharge Summary (Signed)
Patient interviewed and examined this morning. I agree with the discharge plans and followup as outlined.   Angelia Mould. Derrell Lolling, M.D., [facility] General and Minimally invasive Surgery Breast and Colorectal Surgery Office:   [phone] Pager:   [phone]

## 2014-03-14 ENCOUNTER — Other Ambulatory Visit (HOSPITAL_COMMUNITY): Payer: Self-pay | Admitting: Obstetrics

## 2014-03-19 ENCOUNTER — Encounter (HOSPITAL_COMMUNITY): Payer: Self-pay | Admitting: General Surgery

## 2014-04-23 ENCOUNTER — Other Ambulatory Visit: Payer: Self-pay | Admitting: Obstetrics

## 2015-08-08 ENCOUNTER — Encounter (HOSPITAL_COMMUNITY): Payer: Self-pay | Admitting: Emergency Medicine

## 2015-08-08 ENCOUNTER — Emergency Department (HOSPITAL_COMMUNITY)
Admission: EM | Admit: 2015-08-08 | Discharge: 2015-08-08 | Disposition: A | Discharge status: Home / Self Care | Payer: Medicaid Other | Attending: Emergency Medicine | Admitting: Emergency Medicine

## 2015-08-08 DIAGNOSIS — R110 Nausea: Secondary | ICD-10-CM | POA: Insufficient documentation

## 2015-08-08 DIAGNOSIS — I209 Angina pectoris, unspecified: Secondary | ICD-10-CM | POA: Insufficient documentation

## 2015-08-08 DIAGNOSIS — Z3202 Encounter for pregnancy test, result negative: Secondary | ICD-10-CM | POA: Insufficient documentation

## 2015-08-08 DIAGNOSIS — Z79899 Other long term (current) drug therapy: Secondary | ICD-10-CM | POA: Insufficient documentation

## 2015-08-08 DIAGNOSIS — R1013 Epigastric pain: Secondary | ICD-10-CM

## 2015-08-08 DIAGNOSIS — R197 Diarrhea, unspecified: Secondary | ICD-10-CM | POA: Insufficient documentation

## 2015-08-08 DIAGNOSIS — Z87891 Personal history of nicotine dependence: Secondary | ICD-10-CM | POA: Insufficient documentation

## 2015-08-08 LAB — CBC
HCT: 37.5 % (ref 36.0–46.0)
Hemoglobin: 12.2 g/dL (ref 12.0–15.0)
MCH: 27.6 pg (ref 26.0–34.0)
MCHC: 32.5 g/dL (ref 30.0–36.0)
MCV: 84.8 fL (ref 78.0–100.0)
PLATELETS: 267 10*3/uL (ref 150–400)
RBC: 4.42 MIL/uL (ref 3.87–5.11)
RDW: 13.5 % (ref 11.5–15.5)
WBC: 3.6 10*3/uL — AB (ref 4.0–10.5)

## 2015-08-08 LAB — COMPREHENSIVE METABOLIC PANEL
ALK PHOS: 56 U/L (ref 38–126)
ALT: 28 U/L (ref 14–54)
AST: 26 U/L (ref 15–41)
Albumin: 4 g/dL (ref 3.5–5.0)
Anion gap: 9 (ref 5–15)
BILIRUBIN TOTAL: 0.4 mg/dL (ref 0.3–1.2)
BUN: 10 mg/dL (ref 6–20)
CHLORIDE: 107 mmol/L (ref 101–111)
CO2: 25 mmol/L (ref 22–32)
CREATININE: 0.76 mg/dL (ref 0.44–1.00)
Calcium: 8.7 mg/dL — ABNORMAL LOW (ref 8.9–10.3)
GFR calc non Af Amer: >60 mL/min (ref >60)
Glucose, Bld: 96 mg/dL (ref 65–99)
Potassium: 3.7 mmol/L (ref 3.5–5.1)
Sodium: 141 mmol/L (ref 135–145)
Total Protein: 7.7 g/dL (ref 6.5–8.1)

## 2015-08-08 LAB — URINALYSIS, ROUTINE W REFLEX MICROSCOPIC
Bilirubin Urine: NEGATIVE
Glucose, UA: NEGATIVE mg/dL
Hgb urine dipstick: NEGATIVE
KETONES UR: NEGATIVE mg/dL
LEUKOCYTES UA: NEGATIVE
Nitrite: NEGATIVE
Protein, ur: NEGATIVE mg/dL
Specific Gravity, Urine: 1.035 — ABNORMAL HIGH (ref 1.005–1.030)
pH: 5.5 (ref 5.0–8.0)

## 2015-08-08 LAB — I-STAT BETA HCG BLOOD, ED (MC, WL, AP ONLY)

## 2015-08-08 LAB — LIPASE, BLOOD: LIPASE: 20 U/L (ref 11–51)

## 2015-08-08 MED ORDER — DICYCLOMINE HCL 10 MG PO CAPS
10.0000 mg | ORAL_CAPSULE | Freq: Once | ORAL | Status: AC
  Administered 2015-08-08: 10 mg via ORAL
  Filled 2015-08-08: qty 1

## 2015-08-08 MED ORDER — KETOROLAC TROMETHAMINE 30 MG/ML IJ SOLN
30.0000 mg | Freq: Once | INTRAMUSCULAR | Status: AC
  Administered 2015-08-08: 30 mg via INTRAVENOUS
  Filled 2015-08-08: qty 1

## 2015-08-08 MED ORDER — GI COCKTAIL ~~LOC~~
30.0000 mL | Freq: Once | ORAL | Status: AC
  Administered 2015-08-08: 30 mL via ORAL
  Filled 2015-08-08: qty 30

## 2015-08-08 MED ORDER — SODIUM CHLORIDE 0.9 % IV BOLUS (SEPSIS)
1000.0000 mL | Freq: Once | INTRAVENOUS | Status: AC
  Administered 2015-08-08: 1000 mL via INTRAVENOUS

## 2015-08-08 MED ORDER — ONDANSETRON 4 MG PO TBDP: 4.0000 mg | ORAL_TABLET | Freq: Once | ORAL | Status: DC

## 2015-08-08 MED ORDER — OMEPRAZOLE 20 MG PO CPDR: 20.0000 mg | DELAYED_RELEASE_CAPSULE | Freq: Every day | ORAL | Status: AC

## 2015-08-08 MED ORDER — ONDANSETRON HCL 4 MG/2ML IJ SOLN
4.0000 mg | Freq: Once | INTRAMUSCULAR | Status: AC
  Administered 2015-08-08: 4 mg via INTRAVENOUS
  Filled 2015-08-08: qty 2

## 2015-08-08 MED ORDER — ONDANSETRON HCL 4 MG PO TABS: 4.0000 mg | ORAL_TABLET | Freq: Four times a day (QID) | ORAL | Status: AC

## 2015-08-08 MED ORDER — DICYCLOMINE HCL 20 MG PO TABS: 20.0000 mg | ORAL_TABLET | Freq: Two times a day (BID) | ORAL | Status: AC

## 2015-08-08 NOTE — Discharge Instructions (Signed)
Schedule a follow up appointment with a [facility]CP [facility] if your symptoms do not improve. Stay well hydrated. Return to ED with new, worsening or concerning symptoms.    Abdominal Pain, Adult Many things can cause abdominal pain. Usually, abdominal pain is not caused by a disease and will improve without treatment. It can often be observed and treated at home. Your health care provider will do a physical exam and possibly order blood tests and X-rays to help determine the seriousness of your pain. However, in many cases, more time must pass before a clear cause of the pain can be found. Before that point, your health care provider may not know if you need more testing or further treatment. HOME CARE INSTRUCTIONS Monitor your abdominal pain for any changes. The following actions may help to alleviate any discomfort you are experiencing:  Only take over-the-counter or prescription medicines as directed by your health care provider.  Do not take laxatives unless directed to do so by your health care provider.  Try a clear liquid diet (broth, tea, or water) as directed by your health care provider. Slowly move to a bland diet as tolerated. SEEK MEDICAL CARE IF:  You have unexplained abdominal pain.  You have abdominal pain associated with nausea or diarrhea.  You have pain when you urinate or have a bowel movement.  You experience abdominal pain that wakes you in the night.  You have abdominal pain that is worsened or improved by eating food.  You have abdominal pain that is worsened with eating fatty foods.  You have a fever. SEEK IMMEDIATE MEDICAL CARE IF:  Your pain does not go away within 2 hours.  You keep throwing up (vomiting).  Your pain is felt only in portions of the abdomen, such as the right side or the left lower portion of the abdomen.  You pass bloody or black tarry stools. MAKE SURE YOU:  Understand these instructions.  Will watch your  condition.  Will get help right away if you are not doing well or get worse.   This information is not intended to replace advice given to you by your health care provider. Make sure you discuss any questions you have with your health care provider.   Document Released: 02/11/2005 Document Revised: 01/23/2015 Document Reviewed: 01/11/2013 Elsevier Interactive Patient Education [employer]2016 [employer] Inc.

## 2015-08-08 NOTE — ED Notes (Signed)
Upper and lower abdominal pain. C/o nausea. Denies vomiting, diarrhea, pain/difficulties with urination. Hx of gallbladder removed.

## 2015-08-08 NOTE — ED Provider Notes (Signed)
CSN: [id number]     Arrival date & time 08/08/15  1358 History   First MD Initiated Contact with Patient 08/08/15 1510     Chief Complaint  Patient presents with  . Abdominal Pain   HPI  Ms. Chuba is a 30 year old female with a past medical history of cholecystectomy presenting with abdominal pain. Onset of symptoms was 2 days ago. She ate a burger from a fast food restaurant and had cramping upper and lower abdominal pain since. She states the pain has been intermittent over the past 2 days. She describes it as a pulling and cramping. She had 2 episodes of diarrhea on the first 2 days but has not had a bowel movement since. She endorses nausea which has resulted in decreased appetite for fear of vomiting and diarrhea. She notes that she tried to eat chicken wings yesterday and this severely exacerbated the pain. She has been avoiding food since. She states that walking around and being active seems to improve the pain. She denies a history of GERD. Denies known sick contacts. She has not taken any medications prior to arrival. Denies fevers, chills, headache, dizziness, syncope, URI symptoms, belching, sour taste, vomiting, dysuria, hematuria, pelvic pain or vaginal discharge.   Past Medical History  Diagnosis Date  . Anginal pain (HCC)     intermittent- had cardiac workup- unknown cause   Past Surgical History  Procedure Laterality Date  . No past surgeries    . Cholecystectomy N/A 01/09/2014    Procedure: LAPAROSCOPIC CHOLECYSTECTOMY WITH INTRAOPERATIVE CHOLANGIOGRAM;  Surgeon: Ernestene Mention, MD;  Location: [facility];  Service: General;  Laterality: N/A;   Family History  Problem Relation Age of Onset  . Other Neg Hx   . Hypertension Mother   . Diabetes Father   . Diabetes Maternal Grandmother   . Kidney disease Maternal Grandmother    Social History  Substance Use Topics  . Smoking status: Former Smoker    Quit date: 01/10/2008  . Smokeless tobacco: Never Used  . Alcohol Use: No    OB History    Gravida Para Term Preterm AB TAB SAB Ectopic Multiple Living   Review of Systems  Gastrointestinal: Positive for nausea, abdominal pain and diarrhea.  All other systems reviewed and are negative.     Allergies  Review of patient's allergies indicates no known allergies.  Home Medications   Prior to Admission medications   Medication Sig Start Date End Date Taking? Authorizing Provider  levonorgestrel (MIRENA) 20 MCG/24HR IUD 1 each by Intrauterine route once. Placed September 2014   Yes Historical Provider, MD  OVER THE COUNTER MEDICATION Take 1 tablet by mouth 3 (three) times daily. Herbalife Total Control: ginger root extract, green tea leaf extract, oolong tea leaf extract, black tea leaf extract, and pomegranate rind powder extract.   Yes Historical Provider, MD  Prenatal Vit-Fe Fumarate-FA (PRENATAL MULTIVITAMIN) TABS tablet Take 1 tablet by mouth daily at 12 noon.   Yes Historical Provider, MD  dicyclomine (BENTYL) 20 MG tablet Take 1 tablet (20 mg total) by mouth 2 (two) times daily. 08/08/15   Kmya Placide, PA-C  omeprazole (PRILOSEC) 20 MG capsule Take 1 capsule (20 mg total) by mouth daily. 08/08/15   Janice Seales, PA-C  ondansetron (ZOFRAN) 4 MG tablet Take 1 tablet (4 mg total) by mouth every 6 (six) hours. 08/08/15   Catalia Massett, PA-C  oxyCODONE-acetaminophen (PERCOCET/ROXICET) 5-325 MG per  tablet Take 1-2 tablets by mouth every 4 (four) hours as needed for moderate pain. Patient not taking: Reported on 08/08/2015 01/10/14   Sherrie GeorgeWillard Jennings, PA-C   BP 111/67 mmHg  Pulse 66  Temp(Src) 98.2 F (36.8 C) (Oral)  Resp 15  SpO2 100% Physical Exam  Constitutional: She appears well-developed and well-nourished. No distress.  Nontoxic appearing  HENT:  Head: Normocephalic and atraumatic.  Eyes: Conjunctivae are normal. Right eye exhibits no discharge. Left eye exhibits no discharge. No scleral icterus.  Neck: Normal range of motion.   Cardiovascular: Normal rate, regular rhythm and normal heart sounds.   Pulmonary/Chest: Effort normal and breath sounds normal. No respiratory distress. She has no wheezes. She has no rales.  Abdominal: Normal appearance and bowel sounds are normal. There is tenderness in the epigastric area. There is no rigidity, no rebound and no guarding.  Tenderness in the epigastrium. No tenderness over the lower quadrants. No guarding or rebound.   Musculoskeletal: Normal range of motion.  Neurological: She is alert. Coordination normal.  Skin: Skin is warm and dry.  Psychiatric: She has a normal mood and affect. Her behavior is normal.  Nursing note and vitals reviewed.   ED Course  Procedures (including critical care time) Labs Review Labs Reviewed  COMPREHENSIVE METABOLIC PANEL - Abnormal; Notable for the following:    Calcium 8.7 (*)    All other components within normal limits  CBC - Abnormal; Notable for the following:    WBC 3.6 (*)    All other components within normal limits  URINALYSIS, ROUTINE W REFLEX MICROSCOPIC (NOT AT [facility]) - Abnormal; Notable for the following:    Color, Urine AMBER (*)    APPearance CLOUDY (*)    Specific Gravity, Urine 1.035 (*)    All other components within normal limits  LIPASE, BLOOD  I-STAT BETA HCG BLOOD, ED ([facility], [facility], [facility] ONLY)    Imaging Review No results found. I have personally reviewed and evaluated these images and lab results as part of my medical decision-making.   EKG Interpretation None      MDM   Final diagnoses:  Epigastric pain   30 year old female presenting with 2 days of epigastric pain and nausea. Symptoms started after eating take-out food. VSS. Patient is nontoxic, nonseptic appearing, in no apparent distress. Abdomen is soft with tenderness in epigastrium. No peritoneal signs suggesting surgical abdomen. Patient's pain and other symptoms adequately managed in emergency department with Bentyl and Zofran. Fluid bolus given. No  elevated white blood cell count. CMP and lipase unremarkable. Urine shows likely dehydration. No imaging indicated at this time. With epigastric pain that is exacerbated by eating, I suspect this is likely a gastritis presentation. Patient reports improvement in symptoms and is drinking water and eating crackers prior to discharge. Patient does not meet the SIRS or Sepsis criteria. Repeat abdominal exam before discharge with improvement. Patient discharged home with symptomatic treatment and given strict instructions for follow-up with their primary care physician.  I have also discussed reasons to return immediately to the ER.  Patient expresses understanding and agrees with plan.     Alveta HeimlichStevi Raymie Giammarco, PA-C 08/08/15 2024  Laurence Spatesachel Morgan Little, MD 08/08/15 2155

## 2015-08-08 NOTE — Progress Notes (Signed)
This pt is covered by Medicaid family planning only

## 2016-02-02 IMAGING — US US ABDOMEN COMPLETE
1 series · 14 of 25 positions shown · non-contrast
Comparison: None.

CLINICAL DATA: Right upper quadrant pain.

EXAM:
ULTRASOUND ABDOMEN COMPLETE

[Series 1: us abdomen complete · 0.24mm/px · 14 of 73 slices shown]
[im 1/73]
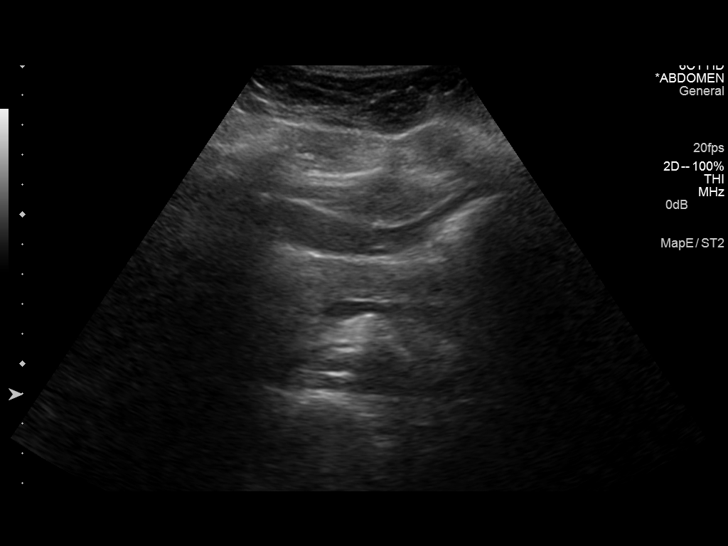
[im 7/73]
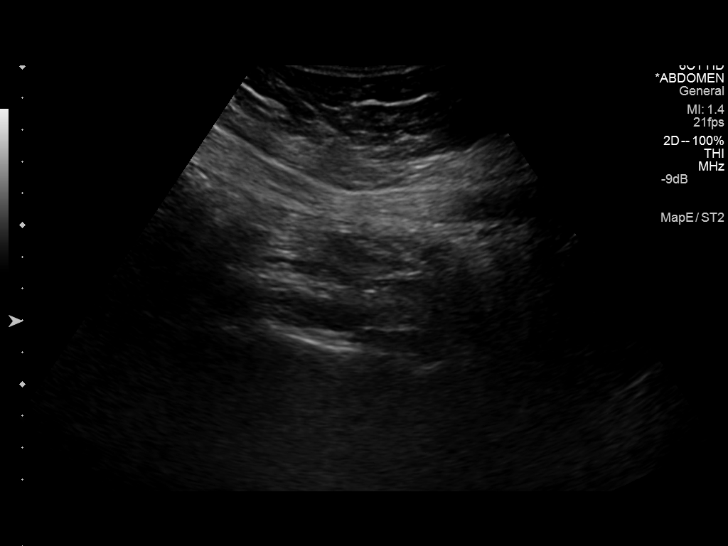
[im 13/73]
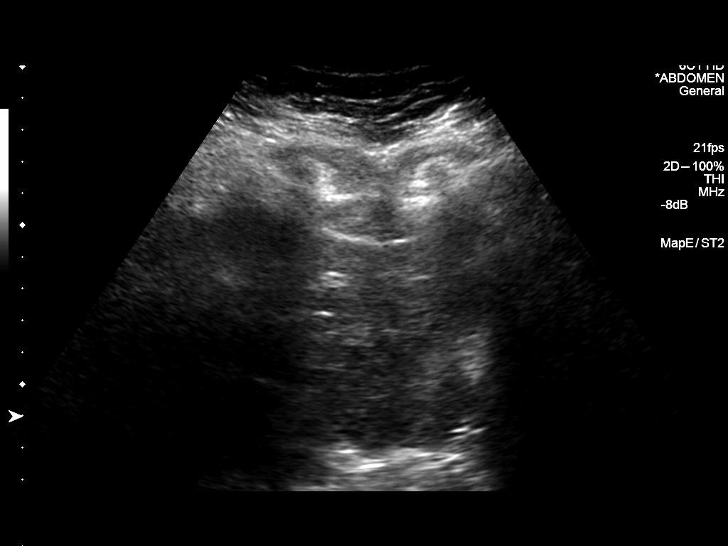
[im 19/73]
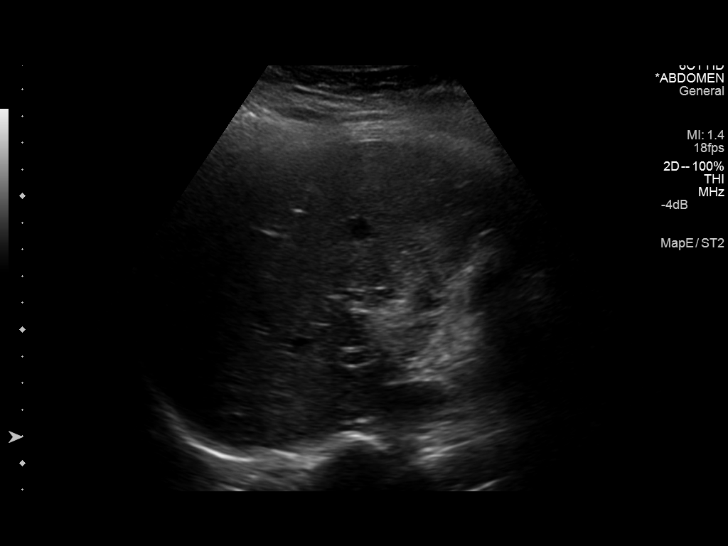
[im 25/73]
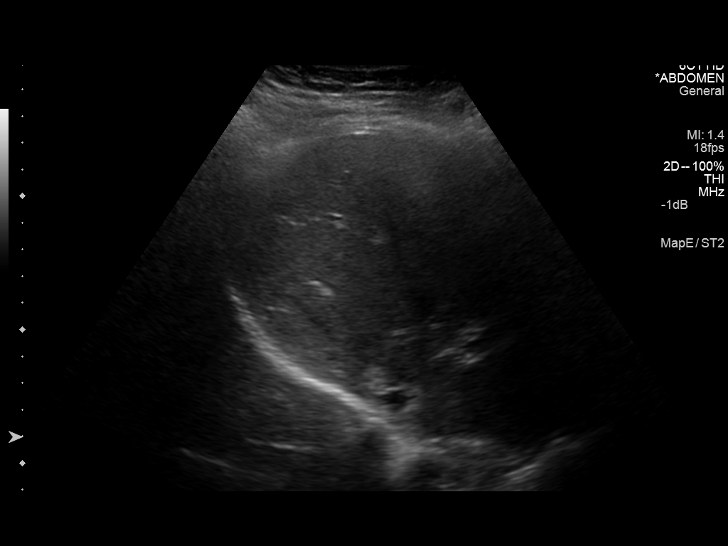
[im 28/73]
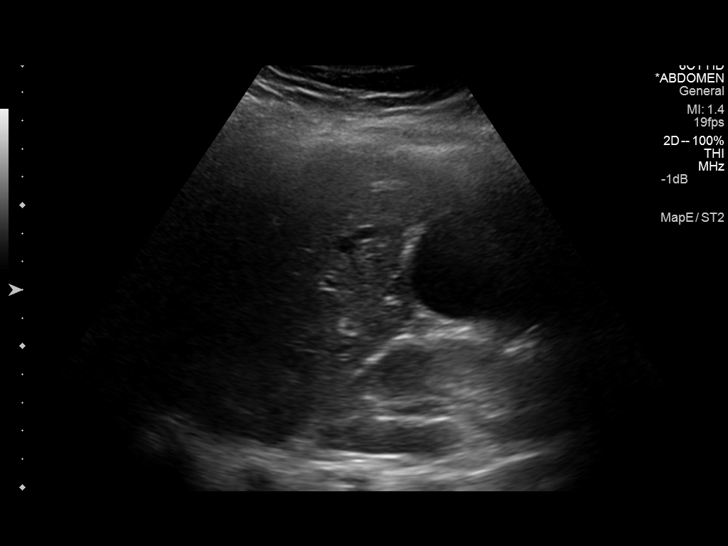
[im 34/73]
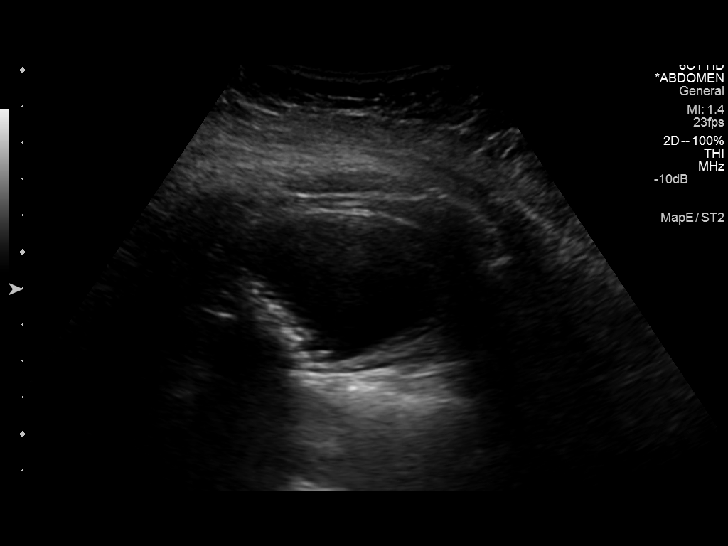
[im 40/73]
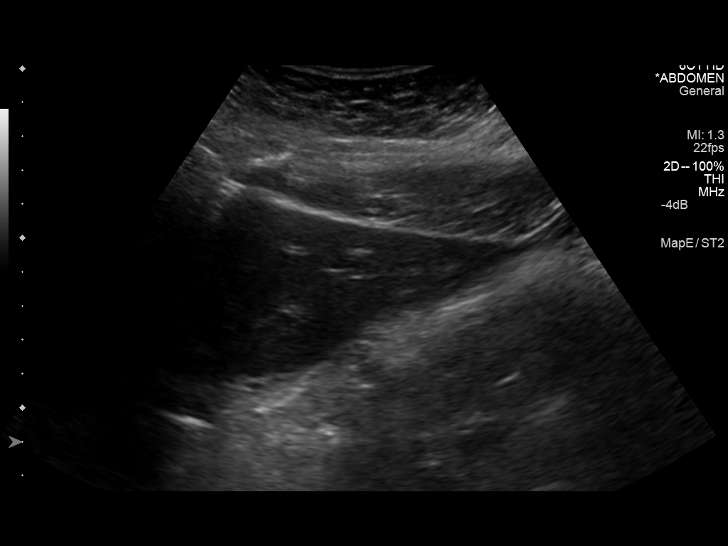
[im 46/73]
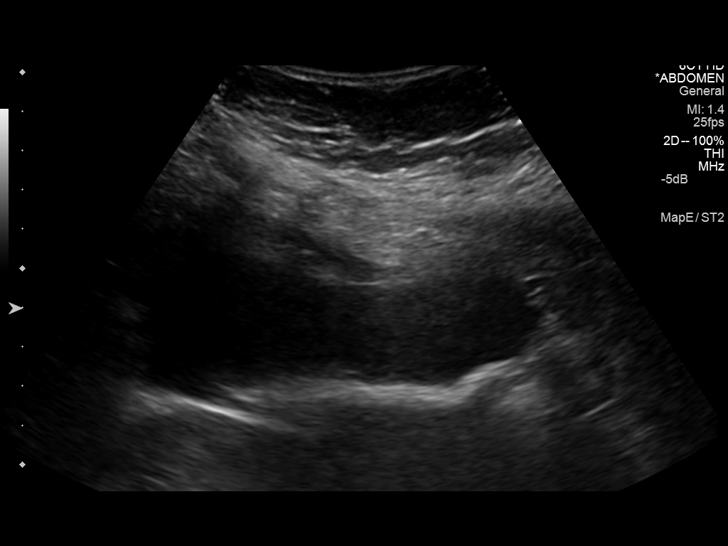
[im 49/73]
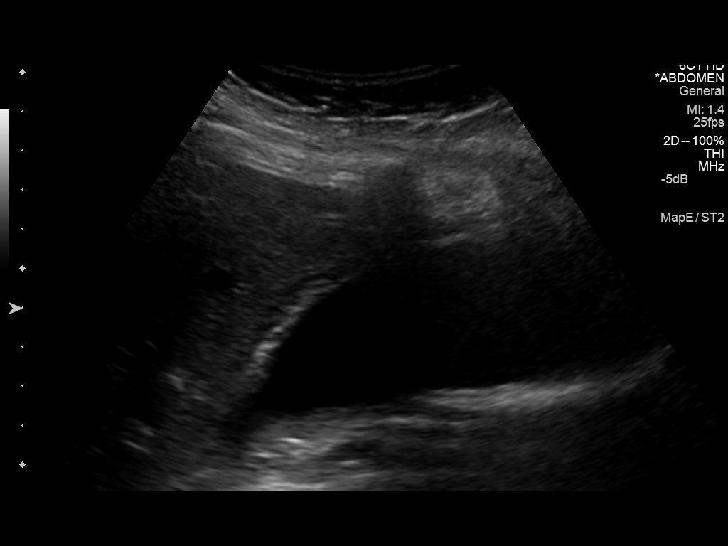
[im 55/73]
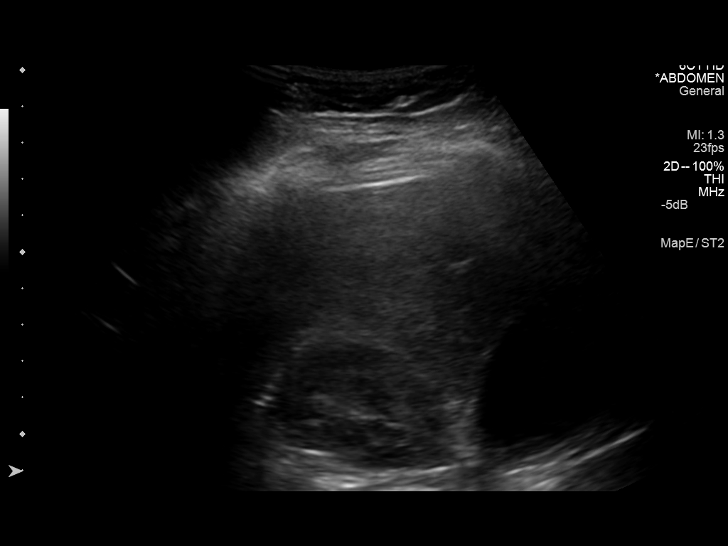
[im 61/73]
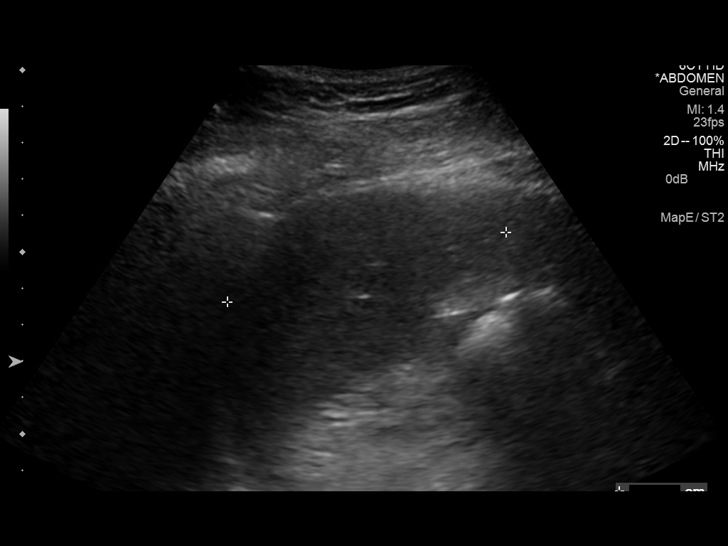
[im 67/73]
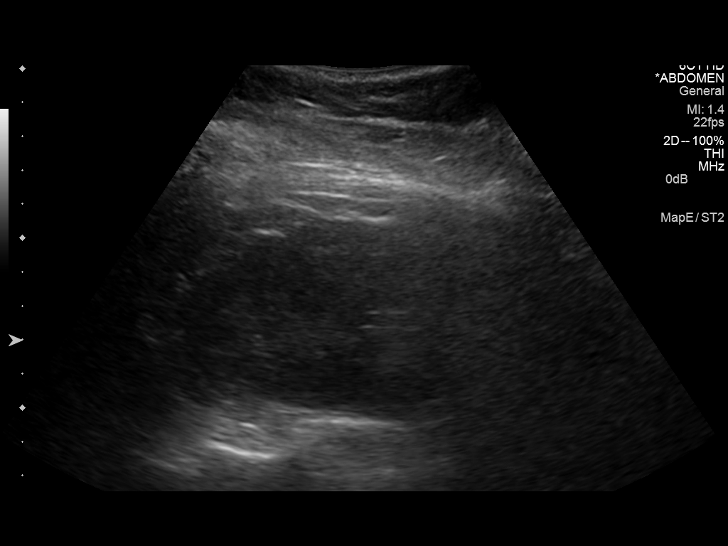
[im 73/73]
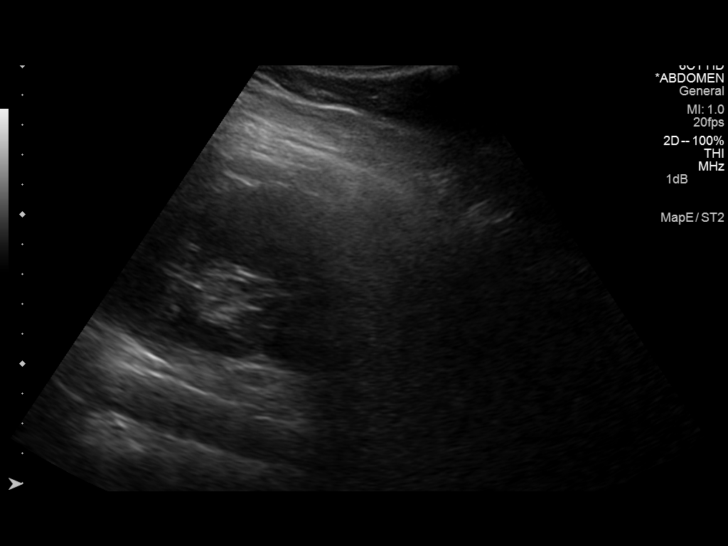

[14 of 25 positions shown; findings below may reference images not displayed]

FINDINGS: Gallbladder:

Mildly distended gallbladder. Small stones layering in the dependent
portion. Gallbladder wall thickening and edema with wall thickness
measured at 7.7 mm. Murphy's sign is positive.

Common bile duct:

Diameter: 5.3 mm, normal

Liver:

Mildly increased hepatic parenchymal echotexture homogeneous lead
suggesting diffuse fatty infiltration.

IVC:

No abnormality visualized.

Pancreas:

Visualized portion unremarkable.

Spleen:

Size and appearance within normal limits.

Right Kidney:

Length: 10.7 cm. Echogenicity within normal limits. No mass or
hydronephrosis visualized.

Left Kidney:

Length: 10.6 cm. Echogenicity within normal limits. No mass or
hydronephrosis visualized.

Abdominal aorta:

No aneurysm visualized.

Other findings:

None.
IMPRESSION: Cholelithiasis with gallbladder wall thickening and positive
Murphy's sign. Appearance is consistent with acute cholecystitis in
the appropriate clinical setting. Probable fatty infiltration of the
liver.

## 2016-12-16 DIAGNOSIS — R350 Frequency of micturition: Secondary | ICD-10-CM | POA: Diagnosis not present

## 2017-03-18 ENCOUNTER — Other Ambulatory Visit: Payer: Self-pay | Admitting: Family Medicine

## 2017-03-18 ENCOUNTER — Other Ambulatory Visit (HOSPITAL_COMMUNITY)
Admission: RE | Admit: 2017-03-18 | Discharge: 2017-03-18 | Disposition: A | Discharge status: Home / Self Care | Payer: 59 | Source: Ambulatory Visit | Attending: Family Medicine | Admitting: Family Medicine

## 2017-03-18 DIAGNOSIS — Z0000 Encounter for general adult medical examination without abnormal findings: Secondary | ICD-10-CM | POA: Diagnosis not present

## 2017-03-18 DIAGNOSIS — Z01411 Encounter for gynecological examination (general) (routine) with abnormal findings: Secondary | ICD-10-CM | POA: Diagnosis present

## 2017-03-18 DIAGNOSIS — Z01419 Encounter for gynecological examination (general) (routine) without abnormal findings: Secondary | ICD-10-CM | POA: Diagnosis not present

## 2017-03-24 LAB — CYTOLOGY - PAP: HPV (WINDOPATH): DETECTED — AB

## 2017-04-27 ENCOUNTER — Other Ambulatory Visit: Payer: Self-pay | Admitting: Obstetrics and Gynecology

## 2017-04-27 DIAGNOSIS — R87612 Low grade squamous intraepithelial lesion on cytologic smear of cervix (LGSIL): Secondary | ICD-10-CM | POA: Diagnosis not present

## 2017-04-27 DIAGNOSIS — N870 Mild cervical dysplasia: Secondary | ICD-10-CM | POA: Diagnosis not present

## 2017-04-27 DIAGNOSIS — N871 Moderate cervical dysplasia: Secondary | ICD-10-CM | POA: Diagnosis not present

## 2017-05-06 DIAGNOSIS — N871 Moderate cervical dysplasia: Secondary | ICD-10-CM | POA: Diagnosis not present

## 2017-10-14 ENCOUNTER — Other Ambulatory Visit: Payer: Self-pay | Admitting: Obstetrics and Gynecology

## 2017-10-14 ENCOUNTER — Other Ambulatory Visit (HOSPITAL_COMMUNITY)
Admission: RE | Admit: 2017-10-14 | Discharge: 2017-10-14 | Disposition: A | Discharge status: Home / Self Care | Payer: 59 | Source: Ambulatory Visit | Attending: Obstetrics and Gynecology | Admitting: Obstetrics and Gynecology

## 2017-10-14 DIAGNOSIS — N871 Moderate cervical dysplasia: Secondary | ICD-10-CM | POA: Diagnosis not present

## 2017-10-14 DIAGNOSIS — Z01419 Encounter for gynecological examination (general) (routine) without abnormal findings: Secondary | ICD-10-CM | POA: Diagnosis not present

## 2017-10-15 LAB — CYTOLOGY - PAP: Diagnosis: NEGATIVE

## 2018-02-15 ENCOUNTER — Encounter (HOSPITAL_COMMUNITY): Payer: Self-pay

## 2018-02-15 ENCOUNTER — Other Ambulatory Visit: Payer: Self-pay

## 2018-02-15 ENCOUNTER — Emergency Department (HOSPITAL_COMMUNITY)
Admission: EM | Admit: 2018-02-15 | Discharge: 2018-02-16 | Disposition: A | Discharge status: Home / Self Care | Payer: 59 | Attending: Emergency Medicine | Admitting: Emergency Medicine

## 2018-02-15 DIAGNOSIS — Z79899 Other long term (current) drug therapy: Secondary | ICD-10-CM | POA: Diagnosis not present

## 2018-02-15 DIAGNOSIS — R0689 Other abnormalities of breathing: Secondary | ICD-10-CM | POA: Diagnosis not present

## 2018-02-15 DIAGNOSIS — Y999 Unspecified external cause status: Secondary | ICD-10-CM | POA: Diagnosis not present

## 2018-02-15 DIAGNOSIS — Y939 Activity, unspecified: Secondary | ICD-10-CM | POA: Diagnosis not present

## 2018-02-15 DIAGNOSIS — Z87891 Personal history of nicotine dependence: Secondary | ICD-10-CM | POA: Insufficient documentation

## 2018-02-15 DIAGNOSIS — R58 Hemorrhage, not elsewhere classified: Secondary | ICD-10-CM | POA: Diagnosis not present

## 2018-02-15 DIAGNOSIS — Y92410 Unspecified street and highway as the place of occurrence of the external cause: Secondary | ICD-10-CM | POA: Insufficient documentation

## 2018-02-15 DIAGNOSIS — S7012XA Contusion of left thigh, initial encounter: Secondary | ICD-10-CM | POA: Diagnosis not present

## 2018-02-15 DIAGNOSIS — R52 Pain, unspecified: Secondary | ICD-10-CM | POA: Diagnosis not present

## 2018-02-15 MED ORDER — METHOCARBAMOL 500 MG PO TABS: 500.0000 mg | ORAL_TABLET | Freq: Three times a day (TID) | ORAL | 0 refills | Status: AC
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
## 2018-02-16 NOTE — ED Notes (Signed)
Patient verbalizes understanding of discharge instructions. Opportunity for questioning and answers were provided. Armband removed by staff, pt discharged from ED to home via POV  

## 2018-05-03 ENCOUNTER — Other Ambulatory Visit (HOSPITAL_COMMUNITY)
Admission: RE | Admit: 2018-05-03 | Discharge: 2018-05-03 | Disposition: A | Discharge status: Home / Self Care | Payer: 59 | Source: Ambulatory Visit | Attending: Obstetrics and Gynecology | Admitting: Obstetrics and Gynecology

## 2018-05-03 ENCOUNTER — Other Ambulatory Visit: Payer: Self-pay | Admitting: Obstetrics and Gynecology

## 2018-05-03 DIAGNOSIS — Z01419 Encounter for gynecological examination (general) (routine) without abnormal findings: Secondary | ICD-10-CM | POA: Diagnosis not present

## 2018-05-03 DIAGNOSIS — N871 Moderate cervical dysplasia: Secondary | ICD-10-CM | POA: Diagnosis not present

## 2018-05-03 DIAGNOSIS — Z309 Encounter for contraceptive management, unspecified: Secondary | ICD-10-CM | POA: Diagnosis not present

## 2018-05-09 LAB — CYTOLOGY - PAP
Diagnosis: NEGATIVE
HPV (WINDOPATH): NOT DETECTED

## 2018-06-06 DIAGNOSIS — Z30433 Encounter for removal and reinsertion of intrauterine contraceptive device: Secondary | ICD-10-CM | POA: Diagnosis not present

## 2018-07-19 DIAGNOSIS — Z30431 Encounter for routine checking of intrauterine contraceptive device: Secondary | ICD-10-CM | POA: Diagnosis not present

## 2022-08-17 ENCOUNTER — Other Ambulatory Visit: Payer: Self-pay | Admitting: Obstetrics and Gynecology

## 2024-02-11 ENCOUNTER — Other Ambulatory Visit: Payer: Self-pay | Admitting: Medical Genetics

## 2024-03-07 ENCOUNTER — Other Ambulatory Visit

## 2024-03-07 DIAGNOSIS — Z006 Encounter for examination for normal comparison and control in clinical research program: Secondary | ICD-10-CM

## 2024-03-24 LAB — GENECONNECT MOLECULAR SCREEN: Genetic Analysis Overall Interpretation: NEGATIVE
